# Patient Record
Sex: Female | Born: 1945
Health system: Southern US, Community
[De-identification: ages and names within clinical notes are randomized; demographics above are authoritative.]

---

## 1997-09-04 ENCOUNTER — Other Ambulatory Visit: Admission: RE | Admit: 1997-09-04 | Discharge: 1997-09-04 | Payer: Self-pay | Admitting: Nephrology

## 1997-10-22 ENCOUNTER — Ambulatory Visit (HOSPITAL_COMMUNITY): Admission: RE | Admit: 1997-10-22 | Discharge: 1997-10-22 | Payer: Self-pay | Admitting: Obstetrics

## 1997-10-24 ENCOUNTER — Other Ambulatory Visit: Admission: RE | Admit: 1997-10-24 | Discharge: 1997-10-24 | Payer: Self-pay | Admitting: Obstetrics

## 1998-10-28 ENCOUNTER — Encounter: Payer: Self-pay | Admitting: Obstetrics

## 1998-10-28 ENCOUNTER — Ambulatory Visit (HOSPITAL_COMMUNITY): Admission: RE | Admit: 1998-10-28 | Discharge: 1998-10-28 | Payer: Self-pay | Admitting: Obstetrics

## 1998-11-27 ENCOUNTER — Other Ambulatory Visit: Admission: RE | Admit: 1998-11-27 | Discharge: 1998-11-27 | Payer: Self-pay | Admitting: Obstetrics

## 1999-10-30 ENCOUNTER — Encounter: Payer: Self-pay | Admitting: Obstetrics

## 1999-10-30 ENCOUNTER — Ambulatory Visit (HOSPITAL_COMMUNITY): Admission: RE | Admit: 1999-10-30 | Discharge: 1999-10-30 | Payer: Self-pay

## 1999-12-16 ENCOUNTER — Other Ambulatory Visit: Admission: RE | Admit: 1999-12-16 | Discharge: 1999-12-16 | Payer: Self-pay | Admitting: Obstetrics

## 2000-11-04 ENCOUNTER — Encounter: Payer: Self-pay | Admitting: Obstetrics

## 2000-11-04 ENCOUNTER — Ambulatory Visit (HOSPITAL_COMMUNITY): Admission: RE | Admit: 2000-11-04 | Discharge: 2000-11-04 | Payer: Self-pay | Admitting: Obstetrics

## 2001-12-22 ENCOUNTER — Ambulatory Visit (HOSPITAL_COMMUNITY): Admission: RE | Admit: 2001-12-22 | Discharge: 2001-12-22 | Payer: Self-pay | Admitting: Obstetrics

## 2001-12-22 ENCOUNTER — Encounter: Payer: Self-pay | Admitting: Obstetrics

## 2002-12-25 ENCOUNTER — Ambulatory Visit (HOSPITAL_COMMUNITY): Admission: RE | Admit: 2002-12-25 | Discharge: 2002-12-25 | Payer: Self-pay | Admitting: Obstetrics

## 2002-12-25 ENCOUNTER — Encounter: Payer: Self-pay | Admitting: Obstetrics

## 2003-12-26 ENCOUNTER — Ambulatory Visit (HOSPITAL_COMMUNITY): Admission: RE | Admit: 2003-12-26 | Discharge: 2003-12-26 | Payer: Self-pay | Admitting: Obstetrics

## 2004-05-12 ENCOUNTER — Encounter: Admission: RE | Admit: 2004-05-12 | Discharge: 2004-05-12 | Payer: Self-pay | Admitting: Nephrology

## 2004-12-29 ENCOUNTER — Ambulatory Visit (HOSPITAL_COMMUNITY): Admission: RE | Admit: 2004-12-29 | Discharge: 2004-12-29 | Payer: Self-pay | Admitting: Obstetrics

## 2005-02-23 ENCOUNTER — Inpatient Hospital Stay (HOSPITAL_COMMUNITY): Admission: EM | Admit: 2005-02-23 | Discharge: 2005-02-27 | Payer: Self-pay | Admitting: Emergency Medicine

## 2005-10-14 ENCOUNTER — Ambulatory Visit (HOSPITAL_COMMUNITY): Admission: RE | Admit: 2005-10-14 | Discharge: 2005-10-14 | Payer: Self-pay | Admitting: Obstetrics

## 2006-01-05 ENCOUNTER — Ambulatory Visit (HOSPITAL_COMMUNITY): Admission: RE | Admit: 2006-01-05 | Discharge: 2006-01-05 | Payer: Self-pay | Admitting: Obstetrics

## 2006-09-06 ENCOUNTER — Ambulatory Visit (HOSPITAL_BASED_OUTPATIENT_CLINIC_OR_DEPARTMENT_OTHER): Admission: RE | Admit: 2006-09-06 | Discharge: 2006-09-06 | Payer: Self-pay | Admitting: Podiatry

## 2007-01-09 ENCOUNTER — Ambulatory Visit (HOSPITAL_COMMUNITY): Admission: RE | Admit: 2007-01-09 | Discharge: 2007-01-09 | Payer: Self-pay | Admitting: Obstetrics

## 2008-01-11 ENCOUNTER — Ambulatory Visit (HOSPITAL_COMMUNITY): Admission: RE | Admit: 2008-01-11 | Discharge: 2008-01-11 | Payer: Self-pay | Admitting: Obstetrics

## 2009-01-14 ENCOUNTER — Ambulatory Visit (HOSPITAL_COMMUNITY): Admission: RE | Admit: 2009-01-14 | Discharge: 2009-01-14 | Payer: Self-pay | Admitting: Obstetrics

## 2010-01-20 ENCOUNTER — Ambulatory Visit (HOSPITAL_COMMUNITY): Admission: RE | Admit: 2010-01-20 | Discharge: 2010-01-20 | Payer: Self-pay | Admitting: Obstetrics

## 2010-08-28 NOTE — Op Note (Signed)
Jasmine Stewart, Jasmine Stewart              ACCOUNT NO.:  [id number]   MEDICAL RECORD NO.:  [id number]          PATIENT TYPE:  AMB   LOCATION:  DSC                          FACILITY:  [facility]   PHYSICIAN:  Ezequiel Kayser. Ajlouny, D.P.M.DATE OF BIRTH:  29-Aug-1945   DATE OF PROCEDURE:  09/06/2006  DATE OF DISCHARGE:  09/06/2006                               OPERATIVE REPORT   Dictation Ended At This Point           ______________________________  Ezequiel Kayser. Harriet Pho, D.P.M.     MJA/MEDQ  D:  09/08/2006  T:  09/08/2006  Job:  [id number]

## 2010-08-28 NOTE — H&P (Signed)
NAMEZONIE, CRUTCHER NO.:  [id number]   MEDICAL RECORD NO.:  [id number]          PATIENT TYPE:  INP   LOCATION:  1823                         FACILITY:  [facility]   PHYSICIAN:  Cherylynn Ridges, M.D.    DATE OF BIRTH:  [birth year]   DATE OF ADMISSION:  02/22/2005  DATE OF DISCHARGE:                                HISTORY & PHYSICAL   IDENTIFICATION AND CHIEF COMPLAINT:  The patient is a 66 year old female  with abdominal pain localized to the right lower quadrant who is being  admitted for cecal inflammation, possible typhlitis and/or cecal tumor.   HISTORY OF PRESENT ILLNESS:  The patient was in her usual state of good  health until about noon or shortly after noon on Monday, February 22, 2005.  At that time, she developed some epigastric and upper periumbilical pain,  which is now localized in the right lower quadrant.  It worsened throughout  the day, was associated with nausea but no vomiting, at least not until she  received the oral CT contrast for her CT study.  At that time, she did vomit  the CT contrast, but only after she had gotten the study done.  She has had  no fevers or chills, and currently has an appetite.  She has never had pain  like this before.  A CT scan demonstrates some cecal thickening with  possible tumor and/or cecal inflammation noted, but the appendix could be  seen and appears to be normal.  There is no free air or fluid.  A surgical  consultation was obtained.   PAST MEDICAL HISTORY:  1.  Increased cholesterol.  2.  Hypertension.   PAST SURGICAL HISTORY:  She has only had tonsils and adenoids removed  previously.   OBSTETRICAL HISTORY:  She is gravida 3, para 2-0-1-2, with the death of a  child at 6 months.   Her last bowel movement was yesterday morning.  Her last meal was about 12  o'clock noon on February 22, 2005.   REVIEW OF SYSTEMS:  She has had no constipation or diarrhea.  No fever or  chills.   PHYSICAL EXAMINATION:   VITAL SIGNS:  She is afebrile at 97.6, pulse of 79,  blood pressure 120/54.  HEENT:  She was normocephalic and atraumatic and anicteric.  She has no  scleral icterus.  NECK:  Supple with no palpable masses.  She has no carotid bruits.  LUNGS:  Clear to auscultation, and she has normal symmetrical excursion.  CARDIAC:  Regular rhythm and rate with no murmurs, gallops, rubs, or heaves.  ABDOMEN:  Mildly to moderately distended with active but hypoactive bowel  sounds.  She is tender in the right lower quadrant primarily with some  voluntary guarding in that area.  No severe peritonitis, and she has no  rebound.  She has no Rovsing sign.  RECTAL:  She had no tenderness.  There was a question of a palpable mass at  the tip of the finger on digital examination, but the was guaiac positive.  PELVIC:  Not performed.  NEUROLOGIC:  Cranial nerves II-XII were  grossly intact.   LABORATORY DATA:  Her white count was 18,000, hemoglobin 12.1, hematocrit  36.6, platelets normal.  Her electrolytes were normal with a creatinine of  1.4.  UA is negative.  It appears to be contaminated with epithelial cells  and many bacteria.  On reviewing the CT scan with the radiologist, the  patient has marked cecal thickening and a normal-looking appendage which  courses down posteriorly and inferiorly toward the pelvis.  No other  evidence of inflammation.   IMPRESSION:  1.  Right lower quadrant pain with cecal thickening on CT scan, indicative      of possible typhlitis and/or cecal tumor.  Further examination or      studies may be necessary.  2.  Guaiac positive stools.  The current etiology is unknown; however, on      rectal examination, she does have what appears to be a palpable mass;      however, this could be redundant mucosa or sort of a retroflexed uterus.      Further examination may be necessary.   PLAN:  1.  Admit the patient and observe her.  2.  Start IV antibiotics for this cecitis and/or  typhlitis, and further      evaluation may be necessary.  3.  Notify Dr. Jeri Cos of the patient's admission to the hospital.  4.  Surgery does not appear to be needed at this time; however, it could be      necessary in the future if symptoms should progress.  She will be      started on IV Zosyn 3.375 gm IV piggyback q.6h. and re-examine later on.      She will be admitted to the [facility] surgical service.      Cherylynn Ridges, M.D.  Electronically Signed     JOW/MEDQ  D:  02/23/2005  T:  02/23/2005  Job:  [id number]   cc:   Jarome Matin, M.D.  Fax: [phone]

## 2010-08-28 NOTE — Op Note (Signed)
[name], Jasmine Stewart              ACCOUNT NO.:  [id number]   MEDICAL RECORD NO.:  [id number]          PATIENT TYPE:  AMB   LOCATION:  DSC                          FACILITY:  [facility]   PHYSICIAN:  Ezequiel Kayser. Ajlouny, D.P.M.DATE OF BIRTH:  11/09/1945   DATE OF PROCEDURE:  09/06/2006  DATE OF DISCHARGE:  09/06/2006                               OPERATIVE REPORT   SURGEON:  Ezequiel Kayser. Harriet Pho, DPM.   ASSISTANT:  Myeong Sheard, DPM.   PREOPERATIVE DIAGNOSES:  1. Hallux abducto valgus deformity, left foot.  2. First metatarsus primus varus, left foot.  3. Elongated second metatarsal, left foot.  4. Hammertoe, second and third left toes.   POSTOPERATIVE DIAGNOSES:  1. Hallux abducto valgus deformity, left foot.  2. First metatarsus primus varus, left foot.  3. Elongated second metatarsal, left foot.  4. Hammertoe, second and third left toes.   PROCEDURES:  1. McBride bunionectomy, left foot.  2. First metatarsal first cuneiform fusion of the Lapidus type, with      internal plate fixation and screw fixation, left foot.  3. Second metatarsal shortening with Weil osteotomy, left foot.  4. Hammertoe repair, second and third left toes.   COMPLICATIONS:  None.   ANESTHESIA:  General anesthesia.   HEMOSTASIS:  Pneumatic ankle tourniquet inflated to 250 mmHg.   DESCRIPTION OF PROCEDURES:  The patient was brought to the OR and placed  in a supine position, at which time monitored anesthesia care was  administered.  A local block was performed with a 1-to-1 mixture of 0.5%  Marcaine plain and 1% lidocaine plain.  A well-padded pneumatic ankle  tourniquet was applied to the foot at the medial malleolus.  The patient  was prepped and draped in the usual aseptic manner.  The foot was  exsanguinated with an Esmarch bandage and the previously applied  tourniquet inflated to 250 mmHg.   A dorsolinear incision was made over the first ray, extending proximally  to the tarsometatarsal joint.   The incision was deepened via sharp and  blunt modalities, taking care to clamp and cauterize all bleeding  vessels, and insuring retraction of all neurovascular structures  encountered.  The incision was deepened along the first metatarsal head,  and an inverted-L capsulotomy was performed.  Capsule and periosteum  were reflected from the head of the first metatarsal.  There was very  mild erosion.  And the medial eminence was resected parallel with the  shaft using a sagittal saw, and all rough edges smoothed with the rotary  bur.  Attention was directed to the first interspace, where dissection  was carried deep to the level of the first intermetatarsal ligament,  which was released.  Adequate tendon was freed from the base of the  proximal phalanx, and the fibular sesamoidal ligament was released.  This allowed good medial transposition medially.  Attention was then  directed to the tarsometatarsal joint.  The incision was deepened  through the fascial layers to the dorsal capsule of the tarsometatarsal  joint.  Blunt dissection was used to release the EHL off the TMT and  retract the tendon  laterally.  The location of the first tarsometatarsal  joint was confirmed directly.  We performed a capsulotomy of the  superior aspect of the first TMT to expose the entire joint.  Care was  taken to insure complete exposure of the plantar and lateral aspects of  the joint.  Care was also taken to protect all neurovascular structures  encountered.  The plantar aspect of the first metatarsal was also freed  with a metatarsal elevator.  A bone was used to make these cuts and  remove the cartilage to the level of the subchondral bone at both the  medial cuneiform and base of the first metatarsal.  These bases were  then resected using the curved osteotome to insure the cartilage was  removed and to allow for lateral and plantar transposition of the first  metatarsal.  Reduction of the first  metatarsal and intermetatarsal angle  were found to be excellent.  Once it was determined to use a Darco LPS  plate, which is a locked plate, once the plate was determined with no  stepoff, a smooth 0.045 K wire was used to place the plate on for good  location.  Before this was performed, actually, a smooth 0.045 K wire  was used to fenestrate the joint at the base of the first metatarsal and  the middle cuneiform.  A compression screw wire was then placed, holding  the great toe in dorsiflexion, and the K wire was in a direction  proximal from the metatarsal into the cuneiform under C-arm guidance  into contact with the lateral proximal cortex.  This cannulated  compression screw was placed percutaneously.  This was found to produce  excellent fixation.  Next, plate fixation was performed using the Darco  LPS plate under C-arm.  The locking drill guide was threaded into the  proximal screw hole.  A 2.5-mm drill to drill through the guide was  performed.  There were 4 screws in this plate, which were all drilled,  measured and advanced with the appropriate screw until flush with the  plate.  This placement was all confirmed with the C-arm, and I used the  0 stepoff plate.  The surgical wound was irrigated with copious amounts  of sterile saline and antibiotic solution.  Deep closure was  accomplished with 3-0 Vicryl.  A medial capsulotomy was performed at the  level of the first metatarsal head and reapproximated with 3-0 Vicryl.  Deep closure was accomplished with 4-0 Vicryl, and skin closure was  accomplished with 4-0 Monocryl in a running, subcuticular fashion.  Next, attention was directed to the third toe, where a curvilinear  incision was made over the metatarsophalangeal joint and extending  dorsally and linearly over the third toe.  The incision was deepened via  sharp and blunt modalities.  Care was taken to clamp and cauterize all bleeding vessels and insuring retraction of all  neurovascular structures  encountered.  At the level of the proximal interphalangeal joint, the  extensor tendon was released, and all soft tissue structures removed  from the head of the proximal phalanx.  Once exposed, the head of the  proximal phalanx was resected using the sagittal saw.  This was found  not to reduce the deformity, and attention was directed to the  metatarsophalangeal joint.  The extensor tendon was released at this  level, and the dorsal medial and lateral capsule were closed.  This was  found not to be adequate reduction; and, therefore, McGlamry elevators  were used to release the plantar plate.  The surgical wound was  irrigated with copious amounts of sterile saline and antibiotic  solution.  The digit was stabilized with a 0.045 K wire to the level of  the metatarsal shaft.  The extensor tendon at the level of the proximal  interphalangeal joint was reapproximated utilizing 4-0 Vicryl, and skin  closure was accomplished with 4-0 and 5-0 nylon.  The exact procedure  was performed to the second toe with 1 exception, that the K wire was  only directed to the level of the proximal phalanx.  This incision was  extended proximally over the second metatarsophalangeal joint, and the  incision was deepened via sharp and blunt modalities, taking care to  clamp and cauterize all bleeding vessels, and insuring retraction of all  neurovascular structures encountered.  Deep and superficial fascia were  separated medially and laterally.  Capsulotomy was performed, allowing  for exposure of the second metatarsal head and distal shaft.  A Weil  oblique osteotomy was performed, allowing the head to elevate and  slightly shorten.  The dorsum of the bone was resected using a rongeur.  The osteotomy was fixated with a 2.4-mm Osteomed cannulated screw  system, measuring 14 mm long.  Fixation was confirmed using the C-arm.  Once confirmed, the surgical wound was irrigated with copious  amounts of  sterile saline and antibiotic solution.  Very minimal deep closure was  performed, allowing for rectus position of the second toe, with 3-0  Vicryl.  Subcutaneous closure was accomplished with 4-0 Vicryl, and skin  closure accomplished with 4-0 nylon.  At approximately 1 hour 38  minutes, the tourniquet was deflated, and part of the procedure was  performed wet until 15 minutes had gone by, at which time the tourniquet  was reinflated.  The foot was dressed with tincture, Steri-Strips,  Adaptic, 4 x 4's, Kling and Coban.  The tourniquet was deflated and  vascular status returned to all digits.  Postoperative anesthesia was  also administered around the surgical sites.  While the patient was  still in the OR, a fiberglass posterior splint was applied.  The patient  was sent to the recovery room with vital signs stable and capillary  refill time to the patient's digits instantaneous.  Both written and oral postoperative instructions were given to the patient.  She should  remain nonweightbearing.  No guarantees given.  All questions answered.           ______________________________  Ezequiel Kayser. Harriet Pho, D.P.M.     MJA/MEDQ  D:  09/08/2006  T:  09/08/2006  Job:  [id number]   cc:   Myeong O. Sheard, D.P.M.

## 2010-08-28 NOTE — Discharge Summary (Signed)
NAMEJAUNICE, MIRZA              ACCOUNT NO.:  [id number]   MEDICAL RECORD NO.:  [id number]          PATIENT TYPE:  INP   LOCATION:  5735                         FACILITY:  [facility]   PHYSICIAN:  Maisie Fus A. Cornett, M.D.DATE OF BIRTH:  04/21/1945   DATE OF ADMISSION:  02/22/2005  DATE OF DISCHARGE:  02/27/2005                                 DISCHARGE SUMMARY   ADMISSION DIAGNOSIS:  Right sided diverticulitis/colitis.   POSTOPERATIVE DIAGNOSIS:  Right sided diverticulitis/colitis.   PROCEDURE:  Abdominal CT scan x2.   HISTORY OF PRESENT ILLNESS:  The patient is a 65 year old female admitted on  February 23, 2005 with left lower quadrant pain. CT showed inflammation of  the cecum without appendicitis. She had a leukocytosis and low grade fever.  She is admitted for intravenous antibiotics and followup.   HOSPITAL COURSE:  She was followed placed on antibiotics daily for right  sided diverticulitis/colitis. Repeat CT scan on February 26, 2005 showed  less inflammation but the concern was raised for a possible malignancy.  There was some stranding around this area but no obvious major abscess that  we could see. Her white count normalized. Her fever curve went away and she  clinically improved. At this point in time, she was tolerating a regular  diet, was passing gas, having bowel function and was without complaint and  feeling much better.   DISPOSITION:  She was discharged home at that point in time on February 27, 2005 in satisfactory condition. I explained to her that this could  potentially represent a malignancy. Without a colonoscopy, this is almost  impossible to tell from CT scanning alone. As her other CT scans I felt  these were improving and with her improving white count and improving fever  curve, she was discharged home on antibiotics for the next 7 to 10 days.   FOLLOW UP:  She will followup with me in 2-3 weeks. I will see her back in  my clinic and arrange for her  to get a colonoscopy at a later time.   DISCHARGE MEDICATIONS:  1.  Augmentin 875 mg b.i.d. for 10 days.  2.  Flagyl 500 mg p.o. t.i.d. for 10 days.  3.  Colace stool softener.  She will resume her preoperative medications as before.   ACTIVITY:  She wanted to go back to work. I told her to wait for about a  week, to make sure she is feeling well enough to return to work.      Thomas A. Cornett, M.D.  Electronically Signed     TAC/MEDQ  D:  02/27/2005  T:  02/28/2005  Job:  [id number]   cc:   [facility]

## 2010-12-28 ENCOUNTER — Other Ambulatory Visit (HOSPITAL_COMMUNITY): Payer: Self-pay | Admitting: Obstetrics

## 2010-12-28 DIAGNOSIS — Z1231 Encounter for screening mammogram for malignant neoplasm of breast: Secondary | ICD-10-CM

## 2011-01-26 ENCOUNTER — Ambulatory Visit (HOSPITAL_COMMUNITY)
Admission: RE | Admit: 2011-01-26 | Discharge: 2011-01-26 | Disposition: A | Discharge status: Home / Self Care | Payer: BC Managed Care – PPO | Source: Ambulatory Visit | Attending: Obstetrics | Admitting: Obstetrics

## 2011-01-26 DIAGNOSIS — Z1231 Encounter for screening mammogram for malignant neoplasm of breast: Secondary | ICD-10-CM | POA: Insufficient documentation

## 2012-01-17 ENCOUNTER — Other Ambulatory Visit (HOSPITAL_COMMUNITY): Payer: Self-pay | Admitting: Family Medicine

## 2012-01-17 DIAGNOSIS — Z1231 Encounter for screening mammogram for malignant neoplasm of breast: Secondary | ICD-10-CM

## 2012-02-02 ENCOUNTER — Ambulatory Visit (HOSPITAL_COMMUNITY)
Admission: RE | Admit: 2012-02-02 | Discharge: 2012-02-02 | Disposition: A | Discharge status: Home / Self Care | Payer: Medicare HMO | Source: Ambulatory Visit | Attending: Family Medicine | Admitting: Family Medicine

## 2012-02-02 DIAGNOSIS — Z1231 Encounter for screening mammogram for malignant neoplasm of breast: Secondary | ICD-10-CM | POA: Insufficient documentation

## 2013-01-02 ENCOUNTER — Other Ambulatory Visit (HOSPITAL_COMMUNITY): Payer: Self-pay | Admitting: Family Medicine

## 2013-01-02 DIAGNOSIS — Z1231 Encounter for screening mammogram for malignant neoplasm of breast: Secondary | ICD-10-CM

## 2013-02-02 ENCOUNTER — Ambulatory Visit (HOSPITAL_COMMUNITY)
Admission: RE | Admit: 2013-02-02 | Discharge: 2013-02-02 | Disposition: A | Discharge status: Home / Self Care | Payer: Medicare HMO | Source: Ambulatory Visit | Attending: Family Medicine | Admitting: Family Medicine

## 2013-02-02 DIAGNOSIS — Z1231 Encounter for screening mammogram for malignant neoplasm of breast: Secondary | ICD-10-CM

## 2013-03-26 ENCOUNTER — Other Ambulatory Visit: Payer: Self-pay | Admitting: Family Medicine

## 2013-03-26 ENCOUNTER — Ambulatory Visit
Admission: RE | Admit: 2013-03-26 | Discharge: 2013-03-26 | Disposition: A | Discharge status: Home / Self Care | Payer: Medicare HMO | Source: Ambulatory Visit | Attending: Family Medicine | Admitting: Family Medicine

## 2013-03-26 DIAGNOSIS — M1250 Traumatic arthropathy, unspecified site: Secondary | ICD-10-CM

## 2014-01-02 ENCOUNTER — Other Ambulatory Visit (HOSPITAL_COMMUNITY): Payer: Self-pay | Admitting: Family Medicine

## 2014-01-02 DIAGNOSIS — Z1231 Encounter for screening mammogram for malignant neoplasm of breast: Secondary | ICD-10-CM

## 2014-02-04 ENCOUNTER — Ambulatory Visit (HOSPITAL_COMMUNITY)
Admission: RE | Admit: 2014-02-04 | Discharge: 2014-02-04 | Disposition: A | Discharge status: Home / Self Care | Payer: Medicare HMO | Source: Ambulatory Visit | Attending: Family Medicine | Admitting: Family Medicine

## 2014-02-04 DIAGNOSIS — Z1231 Encounter for screening mammogram for malignant neoplasm of breast: Secondary | ICD-10-CM | POA: Insufficient documentation

## 2014-05-11 DIAGNOSIS — E669 Obesity, unspecified: Secondary | ICD-10-CM | POA: Diagnosis not present

## 2014-05-11 DIAGNOSIS — E039 Hypothyroidism, unspecified: Secondary | ICD-10-CM | POA: Diagnosis not present

## 2014-05-11 DIAGNOSIS — I10 Essential (primary) hypertension: Secondary | ICD-10-CM | POA: Diagnosis not present

## 2014-05-11 DIAGNOSIS — E0800 Diabetes mellitus due to underlying condition with hyperosmolarity without nonketotic hyperglycemic-hyperosmolar coma (NKHHC): Secondary | ICD-10-CM | POA: Diagnosis not present

## 2014-06-26 DIAGNOSIS — H4011X1 Primary open-angle glaucoma, mild stage: Secondary | ICD-10-CM | POA: Diagnosis not present

## 2014-08-09 DIAGNOSIS — K219 Gastro-esophageal reflux disease without esophagitis: Secondary | ICD-10-CM | POA: Diagnosis not present

## 2014-08-09 DIAGNOSIS — E039 Hypothyroidism, unspecified: Secondary | ICD-10-CM | POA: Diagnosis not present

## 2014-08-09 DIAGNOSIS — I10 Essential (primary) hypertension: Secondary | ICD-10-CM | POA: Diagnosis not present

## 2014-08-09 DIAGNOSIS — J399 Disease of upper respiratory tract, unspecified: Secondary | ICD-10-CM | POA: Diagnosis not present

## 2014-11-05 DIAGNOSIS — E669 Obesity, unspecified: Secondary | ICD-10-CM | POA: Diagnosis not present

## 2014-11-05 DIAGNOSIS — I10 Essential (primary) hypertension: Secondary | ICD-10-CM | POA: Diagnosis not present

## 2014-11-05 DIAGNOSIS — E0800 Diabetes mellitus due to underlying condition with hyperosmolarity without nonketotic hyperglycemic-hyperosmolar coma (NKHHC): Secondary | ICD-10-CM | POA: Diagnosis not present

## 2014-11-05 DIAGNOSIS — E039 Hypothyroidism, unspecified: Secondary | ICD-10-CM | POA: Diagnosis not present

## 2014-11-22 DIAGNOSIS — H4011X1 Primary open-angle glaucoma, mild stage: Secondary | ICD-10-CM | POA: Diagnosis not present

## 2015-01-09 ENCOUNTER — Other Ambulatory Visit: Payer: Self-pay

## 2015-01-09 DIAGNOSIS — Z1231 Encounter for screening mammogram for malignant neoplasm of breast: Secondary | ICD-10-CM

## 2015-01-31 DIAGNOSIS — E0800 Diabetes mellitus due to underlying condition with hyperosmolarity without nonketotic hyperglycemic-hyperosmolar coma (NKHHC): Secondary | ICD-10-CM | POA: Diagnosis not present

## 2015-01-31 DIAGNOSIS — I10 Essential (primary) hypertension: Secondary | ICD-10-CM | POA: Diagnosis not present

## 2015-01-31 DIAGNOSIS — E669 Obesity, unspecified: Secondary | ICD-10-CM | POA: Diagnosis not present

## 2015-01-31 DIAGNOSIS — E039 Hypothyroidism, unspecified: Secondary | ICD-10-CM | POA: Diagnosis not present

## 2015-02-04 DIAGNOSIS — E089 Diabetes mellitus due to underlying condition without complications: Secondary | ICD-10-CM | POA: Diagnosis not present

## 2015-02-04 DIAGNOSIS — E669 Obesity, unspecified: Secondary | ICD-10-CM | POA: Diagnosis not present

## 2015-02-04 DIAGNOSIS — I10 Essential (primary) hypertension: Secondary | ICD-10-CM | POA: Diagnosis not present

## 2015-02-04 DIAGNOSIS — E039 Hypothyroidism, unspecified: Secondary | ICD-10-CM | POA: Diagnosis not present

## 2015-02-07 ENCOUNTER — Ambulatory Visit
Admission: RE | Admit: 2015-02-07 | Discharge: 2015-02-07 | Disposition: A | Discharge status: Home / Self Care | Payer: Commercial Managed Care - HMO | Source: Ambulatory Visit

## 2015-02-07 DIAGNOSIS — Z1231 Encounter for screening mammogram for malignant neoplasm of breast: Secondary | ICD-10-CM | POA: Diagnosis not present

## 2015-04-01 DIAGNOSIS — H401131 Primary open-angle glaucoma, bilateral, mild stage: Secondary | ICD-10-CM | POA: Diagnosis not present

## 2015-05-26 DIAGNOSIS — K5730 Diverticulosis of large intestine without perforation or abscess without bleeding: Secondary | ICD-10-CM | POA: Diagnosis not present

## 2015-05-26 DIAGNOSIS — Z1211 Encounter for screening for malignant neoplasm of colon: Secondary | ICD-10-CM | POA: Diagnosis not present

## 2015-05-26 DIAGNOSIS — K640 First degree hemorrhoids: Secondary | ICD-10-CM | POA: Diagnosis not present

## 2015-06-06 DIAGNOSIS — E089 Diabetes mellitus due to underlying condition without complications: Secondary | ICD-10-CM | POA: Diagnosis not present

## 2015-06-06 DIAGNOSIS — E039 Hypothyroidism, unspecified: Secondary | ICD-10-CM | POA: Diagnosis not present

## 2015-06-06 DIAGNOSIS — E669 Obesity, unspecified: Secondary | ICD-10-CM | POA: Diagnosis not present

## 2015-06-06 DIAGNOSIS — Z6841 Body Mass Index (BMI) 40.0 and over, adult: Secondary | ICD-10-CM | POA: Diagnosis not present

## 2015-06-06 DIAGNOSIS — I10 Essential (primary) hypertension: Secondary | ICD-10-CM | POA: Diagnosis not present

## 2015-06-06 DIAGNOSIS — Z23 Encounter for immunization: Secondary | ICD-10-CM | POA: Diagnosis not present

## 2015-09-01 DIAGNOSIS — J0100 Acute maxillary sinusitis, unspecified: Secondary | ICD-10-CM | POA: Diagnosis not present

## 2015-09-01 DIAGNOSIS — H68013 Acute Eustachian salpingitis, bilateral: Secondary | ICD-10-CM | POA: Diagnosis not present

## 2015-09-15 DIAGNOSIS — J0100 Acute maxillary sinusitis, unspecified: Secondary | ICD-10-CM | POA: Diagnosis not present

## 2015-09-15 DIAGNOSIS — H68013 Acute Eustachian salpingitis, bilateral: Secondary | ICD-10-CM | POA: Diagnosis not present

## 2015-09-23 DIAGNOSIS — J343 Hypertrophy of nasal turbinates: Secondary | ICD-10-CM | POA: Diagnosis not present

## 2015-09-23 DIAGNOSIS — J302 Other seasonal allergic rhinitis: Secondary | ICD-10-CM | POA: Diagnosis not present

## 2015-09-23 DIAGNOSIS — H6502 Acute serous otitis media, left ear: Secondary | ICD-10-CM | POA: Diagnosis not present

## 2015-09-23 DIAGNOSIS — H6121 Impacted cerumen, right ear: Secondary | ICD-10-CM | POA: Diagnosis not present

## 2015-10-08 DIAGNOSIS — R7309 Other abnormal glucose: Secondary | ICD-10-CM | POA: Diagnosis not present

## 2015-10-08 DIAGNOSIS — E785 Hyperlipidemia, unspecified: Secondary | ICD-10-CM | POA: Diagnosis not present

## 2015-10-08 DIAGNOSIS — I10 Essential (primary) hypertension: Secondary | ICD-10-CM | POA: Diagnosis not present

## 2015-10-08 DIAGNOSIS — E089 Diabetes mellitus due to underlying condition without complications: Secondary | ICD-10-CM | POA: Diagnosis not present

## 2015-10-10 DIAGNOSIS — E089 Diabetes mellitus due to underlying condition without complications: Secondary | ICD-10-CM | POA: Diagnosis not present

## 2015-10-10 DIAGNOSIS — E039 Hypothyroidism, unspecified: Secondary | ICD-10-CM | POA: Diagnosis not present

## 2015-10-10 DIAGNOSIS — E669 Obesity, unspecified: Secondary | ICD-10-CM | POA: Diagnosis not present

## 2015-10-10 DIAGNOSIS — I10 Essential (primary) hypertension: Secondary | ICD-10-CM | POA: Diagnosis not present

## 2015-12-05 DIAGNOSIS — H401131 Primary open-angle glaucoma, bilateral, mild stage: Secondary | ICD-10-CM | POA: Diagnosis not present

## 2016-01-26 ENCOUNTER — Other Ambulatory Visit: Payer: Self-pay | Admitting: Family Medicine

## 2016-01-26 DIAGNOSIS — Z1231 Encounter for screening mammogram for malignant neoplasm of breast: Secondary | ICD-10-CM

## 2016-02-09 ENCOUNTER — Ambulatory Visit
Admission: RE | Admit: 2016-02-09 | Discharge: 2016-02-09 | Disposition: A | Discharge status: Home / Self Care | Payer: Commercial Managed Care - HMO | Source: Ambulatory Visit | Attending: Family Medicine | Admitting: Family Medicine

## 2016-02-09 DIAGNOSIS — E669 Obesity, unspecified: Secondary | ICD-10-CM | POA: Diagnosis not present

## 2016-02-09 DIAGNOSIS — I10 Essential (primary) hypertension: Secondary | ICD-10-CM | POA: Diagnosis not present

## 2016-02-09 DIAGNOSIS — Z1231 Encounter for screening mammogram for malignant neoplasm of breast: Secondary | ICD-10-CM | POA: Diagnosis not present

## 2016-02-09 DIAGNOSIS — E039 Hypothyroidism, unspecified: Secondary | ICD-10-CM | POA: Diagnosis not present

## 2016-02-09 DIAGNOSIS — E089 Diabetes mellitus due to underlying condition without complications: Secondary | ICD-10-CM | POA: Diagnosis not present

## 2016-02-09 DIAGNOSIS — E119 Type 2 diabetes mellitus without complications: Secondary | ICD-10-CM | POA: Diagnosis not present

## 2016-02-13 DIAGNOSIS — Z6841 Body Mass Index (BMI) 40.0 and over, adult: Secondary | ICD-10-CM | POA: Diagnosis not present

## 2016-02-13 DIAGNOSIS — E089 Diabetes mellitus due to underlying condition without complications: Secondary | ICD-10-CM | POA: Diagnosis not present

## 2016-02-13 DIAGNOSIS — I10 Essential (primary) hypertension: Secondary | ICD-10-CM | POA: Diagnosis not present

## 2016-02-13 DIAGNOSIS — K219 Gastro-esophageal reflux disease without esophagitis: Secondary | ICD-10-CM | POA: Diagnosis not present

## 2016-06-10 DIAGNOSIS — H401131 Primary open-angle glaucoma, bilateral, mild stage: Secondary | ICD-10-CM | POA: Diagnosis not present

## 2016-06-16 DIAGNOSIS — E039 Hypothyroidism, unspecified: Secondary | ICD-10-CM | POA: Diagnosis not present

## 2016-06-16 DIAGNOSIS — E089 Diabetes mellitus due to underlying condition without complications: Secondary | ICD-10-CM | POA: Diagnosis not present

## 2016-06-16 DIAGNOSIS — I10 Essential (primary) hypertension: Secondary | ICD-10-CM | POA: Diagnosis not present

## 2016-06-16 DIAGNOSIS — E669 Obesity, unspecified: Secondary | ICD-10-CM | POA: Diagnosis not present

## 2016-06-18 ENCOUNTER — Ambulatory Visit
Admission: RE | Admit: 2016-06-18 | Discharge: 2016-06-18 | Disposition: A | Discharge status: Home / Self Care | Payer: Commercial Managed Care - HMO | Source: Ambulatory Visit | Attending: Family Medicine | Admitting: Family Medicine

## 2016-06-18 ENCOUNTER — Other Ambulatory Visit: Payer: Self-pay | Admitting: Family Medicine

## 2016-06-18 DIAGNOSIS — E669 Obesity, unspecified: Secondary | ICD-10-CM | POA: Diagnosis not present

## 2016-06-18 DIAGNOSIS — M19041 Primary osteoarthritis, right hand: Secondary | ICD-10-CM | POA: Diagnosis not present

## 2016-06-18 DIAGNOSIS — M150 Primary generalized (osteo)arthritis: Secondary | ICD-10-CM

## 2016-06-18 DIAGNOSIS — I10 Essential (primary) hypertension: Secondary | ICD-10-CM | POA: Diagnosis not present

## 2016-06-18 DIAGNOSIS — M19042 Primary osteoarthritis, left hand: Secondary | ICD-10-CM | POA: Diagnosis not present

## 2016-06-18 DIAGNOSIS — M19031 Primary osteoarthritis, right wrist: Secondary | ICD-10-CM | POA: Diagnosis not present

## 2016-06-18 DIAGNOSIS — E039 Hypothyroidism, unspecified: Secondary | ICD-10-CM | POA: Diagnosis not present

## 2016-06-18 DIAGNOSIS — M130 Polyarthritis, unspecified: Secondary | ICD-10-CM | POA: Diagnosis not present

## 2016-06-18 DIAGNOSIS — M19032 Primary osteoarthritis, left wrist: Secondary | ICD-10-CM | POA: Diagnosis not present

## 2016-07-02 DIAGNOSIS — E669 Obesity, unspecified: Secondary | ICD-10-CM | POA: Diagnosis not present

## 2016-07-02 DIAGNOSIS — I10 Essential (primary) hypertension: Secondary | ICD-10-CM | POA: Diagnosis not present

## 2016-07-02 DIAGNOSIS — E039 Hypothyroidism, unspecified: Secondary | ICD-10-CM | POA: Diagnosis not present

## 2016-07-02 DIAGNOSIS — M130 Polyarthritis, unspecified: Secondary | ICD-10-CM | POA: Diagnosis not present

## 2016-10-19 DIAGNOSIS — H401131 Primary open-angle glaucoma, bilateral, mild stage: Secondary | ICD-10-CM | POA: Diagnosis not present

## 2016-10-19 DIAGNOSIS — H5712 Ocular pain, left eye: Secondary | ICD-10-CM | POA: Diagnosis not present

## 2016-10-28 DIAGNOSIS — M130 Polyarthritis, unspecified: Secondary | ICD-10-CM | POA: Diagnosis not present

## 2016-10-28 DIAGNOSIS — E669 Obesity, unspecified: Secondary | ICD-10-CM | POA: Diagnosis not present

## 2016-10-28 DIAGNOSIS — E039 Hypothyroidism, unspecified: Secondary | ICD-10-CM | POA: Diagnosis not present

## 2016-10-28 DIAGNOSIS — I10 Essential (primary) hypertension: Secondary | ICD-10-CM | POA: Diagnosis not present

## 2016-10-28 DIAGNOSIS — E119 Type 2 diabetes mellitus without complications: Secondary | ICD-10-CM | POA: Diagnosis not present

## 2016-11-03 DIAGNOSIS — E089 Diabetes mellitus due to underlying condition without complications: Secondary | ICD-10-CM | POA: Diagnosis not present

## 2016-11-03 DIAGNOSIS — E039 Hypothyroidism, unspecified: Secondary | ICD-10-CM | POA: Diagnosis not present

## 2016-11-03 DIAGNOSIS — M130 Polyarthritis, unspecified: Secondary | ICD-10-CM | POA: Diagnosis not present

## 2016-11-03 DIAGNOSIS — I10 Essential (primary) hypertension: Secondary | ICD-10-CM | POA: Diagnosis not present

## 2016-12-03 DIAGNOSIS — M19071 Primary osteoarthritis, right ankle and foot: Secondary | ICD-10-CM | POA: Diagnosis not present

## 2016-12-03 DIAGNOSIS — B351 Tinea unguium: Secondary | ICD-10-CM | POA: Diagnosis not present

## 2016-12-03 DIAGNOSIS — S82831A Other fracture of upper and lower end of right fibula, initial encounter for closed fracture: Secondary | ICD-10-CM | POA: Diagnosis not present

## 2016-12-10 DIAGNOSIS — S82831D Other fracture of upper and lower end of right fibula, subsequent encounter for closed fracture with routine healing: Secondary | ICD-10-CM | POA: Diagnosis not present

## 2016-12-10 DIAGNOSIS — M19071 Primary osteoarthritis, right ankle and foot: Secondary | ICD-10-CM | POA: Diagnosis not present

## 2016-12-23 DIAGNOSIS — M7751 Other enthesopathy of right foot: Secondary | ICD-10-CM | POA: Diagnosis not present

## 2016-12-23 DIAGNOSIS — M19071 Primary osteoarthritis, right ankle and foot: Secondary | ICD-10-CM | POA: Diagnosis not present

## 2016-12-23 DIAGNOSIS — S82831D Other fracture of upper and lower end of right fibula, subsequent encounter for closed fracture with routine healing: Secondary | ICD-10-CM | POA: Diagnosis not present

## 2016-12-30 ENCOUNTER — Other Ambulatory Visit: Payer: Self-pay | Admitting: Family Medicine

## 2016-12-30 DIAGNOSIS — Z1231 Encounter for screening mammogram for malignant neoplasm of breast: Secondary | ICD-10-CM

## 2017-01-06 DIAGNOSIS — L600 Ingrowing nail: Secondary | ICD-10-CM | POA: Diagnosis not present

## 2017-01-06 DIAGNOSIS — S82831D Other fracture of upper and lower end of right fibula, subsequent encounter for closed fracture with routine healing: Secondary | ICD-10-CM | POA: Diagnosis not present

## 2017-01-17 DIAGNOSIS — Z0000 Encounter for general adult medical examination without abnormal findings: Secondary | ICD-10-CM | POA: Diagnosis not present

## 2017-01-17 DIAGNOSIS — R109 Unspecified abdominal pain: Secondary | ICD-10-CM | POA: Diagnosis not present

## 2017-02-18 ENCOUNTER — Ambulatory Visit
Admission: RE | Admit: 2017-02-18 | Discharge: 2017-02-18 | Disposition: A | Discharge status: Home / Self Care | Payer: Medicare HMO | Source: Ambulatory Visit | Attending: Family Medicine | Admitting: Family Medicine

## 2017-02-18 DIAGNOSIS — Z1231 Encounter for screening mammogram for malignant neoplasm of breast: Secondary | ICD-10-CM

## 2017-03-09 DIAGNOSIS — M130 Polyarthritis, unspecified: Secondary | ICD-10-CM | POA: Diagnosis not present

## 2017-03-09 DIAGNOSIS — E785 Hyperlipidemia, unspecified: Secondary | ICD-10-CM | POA: Diagnosis not present

## 2017-03-09 DIAGNOSIS — E669 Obesity, unspecified: Secondary | ICD-10-CM | POA: Diagnosis not present

## 2017-03-09 DIAGNOSIS — E039 Hypothyroidism, unspecified: Secondary | ICD-10-CM | POA: Diagnosis not present

## 2017-03-09 DIAGNOSIS — I10 Essential (primary) hypertension: Secondary | ICD-10-CM | POA: Diagnosis not present

## 2017-03-09 DIAGNOSIS — E119 Type 2 diabetes mellitus without complications: Secondary | ICD-10-CM | POA: Diagnosis not present

## 2017-03-15 DIAGNOSIS — E089 Diabetes mellitus due to underlying condition without complications: Secondary | ICD-10-CM | POA: Diagnosis not present

## 2017-03-15 DIAGNOSIS — E039 Hypothyroidism, unspecified: Secondary | ICD-10-CM | POA: Diagnosis not present

## 2017-03-15 DIAGNOSIS — I10 Essential (primary) hypertension: Secondary | ICD-10-CM | POA: Diagnosis not present

## 2017-03-28 DIAGNOSIS — Z780 Asymptomatic menopausal state: Secondary | ICD-10-CM | POA: Diagnosis not present

## 2017-05-28 DIAGNOSIS — R05 Cough: Secondary | ICD-10-CM | POA: Diagnosis not present

## 2017-05-28 DIAGNOSIS — J019 Acute sinusitis, unspecified: Secondary | ICD-10-CM | POA: Diagnosis not present

## 2017-06-01 DIAGNOSIS — J069 Acute upper respiratory infection, unspecified: Secondary | ICD-10-CM | POA: Diagnosis not present

## 2017-06-07 DIAGNOSIS — E039 Hypothyroidism, unspecified: Secondary | ICD-10-CM | POA: Diagnosis not present

## 2017-06-07 DIAGNOSIS — E6609 Other obesity due to excess calories: Secondary | ICD-10-CM | POA: Diagnosis not present

## 2017-06-07 DIAGNOSIS — E119 Type 2 diabetes mellitus without complications: Secondary | ICD-10-CM | POA: Diagnosis not present

## 2017-06-07 DIAGNOSIS — I10 Essential (primary) hypertension: Secondary | ICD-10-CM | POA: Diagnosis not present

## 2017-06-07 DIAGNOSIS — M130 Polyarthritis, unspecified: Secondary | ICD-10-CM | POA: Diagnosis not present

## 2017-06-13 DIAGNOSIS — E119 Type 2 diabetes mellitus without complications: Secondary | ICD-10-CM | POA: Diagnosis not present

## 2017-06-13 DIAGNOSIS — I10 Essential (primary) hypertension: Secondary | ICD-10-CM | POA: Diagnosis not present

## 2017-06-13 DIAGNOSIS — E6609 Other obesity due to excess calories: Secondary | ICD-10-CM | POA: Diagnosis not present

## 2017-07-27 DIAGNOSIS — E119 Type 2 diabetes mellitus without complications: Secondary | ICD-10-CM | POA: Diagnosis not present

## 2017-07-27 DIAGNOSIS — H401131 Primary open-angle glaucoma, bilateral, mild stage: Secondary | ICD-10-CM | POA: Diagnosis not present

## 2017-07-27 DIAGNOSIS — H2513 Age-related nuclear cataract, bilateral: Secondary | ICD-10-CM | POA: Diagnosis not present

## 2017-09-07 DIAGNOSIS — M130 Polyarthritis, unspecified: Secondary | ICD-10-CM | POA: Diagnosis not present

## 2017-09-07 DIAGNOSIS — E6609 Other obesity due to excess calories: Secondary | ICD-10-CM | POA: Diagnosis not present

## 2017-09-07 DIAGNOSIS — I10 Essential (primary) hypertension: Secondary | ICD-10-CM | POA: Diagnosis not present

## 2017-09-07 DIAGNOSIS — E119 Type 2 diabetes mellitus without complications: Secondary | ICD-10-CM | POA: Diagnosis not present

## 2017-09-12 DIAGNOSIS — I10 Essential (primary) hypertension: Secondary | ICD-10-CM | POA: Diagnosis not present

## 2017-09-12 DIAGNOSIS — M130 Polyarthritis, unspecified: Secondary | ICD-10-CM | POA: Diagnosis not present

## 2017-09-12 DIAGNOSIS — E089 Diabetes mellitus due to underlying condition without complications: Secondary | ICD-10-CM | POA: Diagnosis not present

## 2017-09-12 DIAGNOSIS — E669 Obesity, unspecified: Secondary | ICD-10-CM | POA: Diagnosis not present

## 2017-11-01 DIAGNOSIS — H401131 Primary open-angle glaucoma, bilateral, mild stage: Secondary | ICD-10-CM | POA: Diagnosis not present

## 2017-11-01 DIAGNOSIS — H2513 Age-related nuclear cataract, bilateral: Secondary | ICD-10-CM | POA: Diagnosis not present

## 2017-11-01 DIAGNOSIS — E119 Type 2 diabetes mellitus without complications: Secondary | ICD-10-CM | POA: Diagnosis not present

## 2017-11-01 DIAGNOSIS — H31002 Unspecified chorioretinal scars, left eye: Secondary | ICD-10-CM | POA: Diagnosis not present

## 2017-12-19 DIAGNOSIS — J069 Acute upper respiratory infection, unspecified: Secondary | ICD-10-CM | POA: Diagnosis not present

## 2017-12-19 DIAGNOSIS — Z6841 Body Mass Index (BMI) 40.0 and over, adult: Secondary | ICD-10-CM | POA: Diagnosis not present

## 2018-01-09 DIAGNOSIS — I10 Essential (primary) hypertension: Secondary | ICD-10-CM | POA: Diagnosis not present

## 2018-01-09 DIAGNOSIS — E0800 Diabetes mellitus due to underlying condition with hyperosmolarity without nonketotic hyperglycemic-hyperosmolar coma (NKHHC): Secondary | ICD-10-CM | POA: Diagnosis not present

## 2018-01-09 DIAGNOSIS — E039 Hypothyroidism, unspecified: Secondary | ICD-10-CM | POA: Diagnosis not present

## 2018-01-09 DIAGNOSIS — J399 Disease of upper respiratory tract, unspecified: Secondary | ICD-10-CM | POA: Diagnosis not present

## 2018-01-09 DIAGNOSIS — E669 Obesity, unspecified: Secondary | ICD-10-CM | POA: Diagnosis not present

## 2018-01-13 DIAGNOSIS — Z6839 Body mass index (BMI) 39.0-39.9, adult: Secondary | ICD-10-CM | POA: Diagnosis not present

## 2018-01-13 DIAGNOSIS — E6609 Other obesity due to excess calories: Secondary | ICD-10-CM | POA: Diagnosis not present

## 2018-01-13 DIAGNOSIS — M130 Polyarthritis, unspecified: Secondary | ICD-10-CM | POA: Diagnosis not present

## 2018-01-13 DIAGNOSIS — I10 Essential (primary) hypertension: Secondary | ICD-10-CM | POA: Diagnosis not present

## 2018-01-13 DIAGNOSIS — K219 Gastro-esophageal reflux disease without esophagitis: Secondary | ICD-10-CM | POA: Diagnosis not present

## 2018-01-13 DIAGNOSIS — E039 Hypothyroidism, unspecified: Secondary | ICD-10-CM | POA: Diagnosis not present

## 2018-01-16 ENCOUNTER — Other Ambulatory Visit: Payer: Self-pay | Admitting: Family Medicine

## 2018-01-16 DIAGNOSIS — Z1231 Encounter for screening mammogram for malignant neoplasm of breast: Secondary | ICD-10-CM

## 2018-02-10 DIAGNOSIS — M130 Polyarthritis, unspecified: Secondary | ICD-10-CM | POA: Diagnosis not present

## 2018-02-10 DIAGNOSIS — I10 Essential (primary) hypertension: Secondary | ICD-10-CM | POA: Diagnosis not present

## 2018-02-10 DIAGNOSIS — Z6839 Body mass index (BMI) 39.0-39.9, adult: Secondary | ICD-10-CM | POA: Diagnosis not present

## 2018-02-10 DIAGNOSIS — Z0000 Encounter for general adult medical examination without abnormal findings: Secondary | ICD-10-CM | POA: Diagnosis not present

## 2018-02-20 ENCOUNTER — Ambulatory Visit
Admission: RE | Admit: 2018-02-20 | Discharge: 2018-02-20 | Disposition: A | Discharge status: Home / Self Care | Payer: Medicare HMO | Source: Ambulatory Visit | Attending: Family Medicine | Admitting: Family Medicine

## 2018-02-20 DIAGNOSIS — Z1231 Encounter for screening mammogram for malignant neoplasm of breast: Secondary | ICD-10-CM | POA: Diagnosis not present

## 2018-03-01 DIAGNOSIS — H401131 Primary open-angle glaucoma, bilateral, mild stage: Secondary | ICD-10-CM | POA: Diagnosis not present

## 2018-03-01 DIAGNOSIS — H2513 Age-related nuclear cataract, bilateral: Secondary | ICD-10-CM | POA: Diagnosis not present

## 2018-03-01 DIAGNOSIS — H31002 Unspecified chorioretinal scars, left eye: Secondary | ICD-10-CM | POA: Diagnosis not present

## 2018-05-16 DIAGNOSIS — R7309 Other abnormal glucose: Secondary | ICD-10-CM | POA: Diagnosis not present

## 2018-05-16 DIAGNOSIS — E119 Type 2 diabetes mellitus without complications: Secondary | ICD-10-CM | POA: Diagnosis not present

## 2018-05-16 DIAGNOSIS — R799 Abnormal finding of blood chemistry, unspecified: Secondary | ICD-10-CM | POA: Diagnosis not present

## 2018-05-16 DIAGNOSIS — I10 Essential (primary) hypertension: Secondary | ICD-10-CM | POA: Diagnosis not present

## 2018-05-19 DIAGNOSIS — K219 Gastro-esophageal reflux disease without esophagitis: Secondary | ICD-10-CM | POA: Diagnosis not present

## 2018-05-19 DIAGNOSIS — E668 Other obesity: Secondary | ICD-10-CM | POA: Diagnosis not present

## 2018-05-19 DIAGNOSIS — M130 Polyarthritis, unspecified: Secondary | ICD-10-CM | POA: Diagnosis not present

## 2018-05-19 DIAGNOSIS — I10 Essential (primary) hypertension: Secondary | ICD-10-CM | POA: Diagnosis not present

## 2018-09-19 DIAGNOSIS — R7989 Other specified abnormal findings of blood chemistry: Secondary | ICD-10-CM | POA: Diagnosis not present

## 2018-09-19 DIAGNOSIS — E668 Other obesity: Secondary | ICD-10-CM | POA: Diagnosis not present

## 2018-09-19 DIAGNOSIS — I10 Essential (primary) hypertension: Secondary | ICD-10-CM | POA: Diagnosis not present

## 2018-09-19 DIAGNOSIS — D510 Vitamin B12 deficiency anemia due to intrinsic factor deficiency: Secondary | ICD-10-CM | POA: Diagnosis not present

## 2018-09-19 DIAGNOSIS — M130 Polyarthritis, unspecified: Secondary | ICD-10-CM | POA: Diagnosis not present

## 2018-09-19 DIAGNOSIS — E1169 Type 2 diabetes mellitus with other specified complication: Secondary | ICD-10-CM | POA: Diagnosis not present

## 2018-09-19 DIAGNOSIS — E039 Hypothyroidism, unspecified: Secondary | ICD-10-CM | POA: Diagnosis not present

## 2018-09-19 DIAGNOSIS — R799 Abnormal finding of blood chemistry, unspecified: Secondary | ICD-10-CM | POA: Diagnosis not present

## 2018-09-19 DIAGNOSIS — L638 Other alopecia areata: Secondary | ICD-10-CM | POA: Diagnosis not present

## 2018-09-19 DIAGNOSIS — K219 Gastro-esophageal reflux disease without esophagitis: Secondary | ICD-10-CM | POA: Diagnosis not present

## 2018-09-22 DIAGNOSIS — E1169 Type 2 diabetes mellitus with other specified complication: Secondary | ICD-10-CM | POA: Diagnosis not present

## 2018-09-22 DIAGNOSIS — E039 Hypothyroidism, unspecified: Secondary | ICD-10-CM | POA: Diagnosis not present

## 2018-09-22 DIAGNOSIS — L638 Other alopecia areata: Secondary | ICD-10-CM | POA: Diagnosis not present

## 2018-09-22 DIAGNOSIS — I10 Essential (primary) hypertension: Secondary | ICD-10-CM | POA: Diagnosis not present

## 2018-10-17 DIAGNOSIS — H401131 Primary open-angle glaucoma, bilateral, mild stage: Secondary | ICD-10-CM | POA: Diagnosis not present

## 2018-10-17 DIAGNOSIS — H5203 Hypermetropia, bilateral: Secondary | ICD-10-CM | POA: Diagnosis not present

## 2018-10-17 DIAGNOSIS — H31002 Unspecified chorioretinal scars, left eye: Secondary | ICD-10-CM | POA: Diagnosis not present

## 2018-10-17 DIAGNOSIS — H2513 Age-related nuclear cataract, bilateral: Secondary | ICD-10-CM | POA: Diagnosis not present

## 2018-10-17 DIAGNOSIS — H52223 Regular astigmatism, bilateral: Secondary | ICD-10-CM | POA: Diagnosis not present

## 2018-11-03 DIAGNOSIS — L638 Other alopecia areata: Secondary | ICD-10-CM | POA: Diagnosis not present

## 2018-11-03 DIAGNOSIS — I10 Essential (primary) hypertension: Secondary | ICD-10-CM | POA: Diagnosis not present

## 2018-11-24 ENCOUNTER — Ambulatory Visit (INDEPENDENT_AMBULATORY_CARE_PROVIDER_SITE_OTHER): Payer: Medicare HMO

## 2018-11-24 ENCOUNTER — Ambulatory Visit: Payer: Medicare HMO | Admitting: Podiatry

## 2018-11-24 ENCOUNTER — Other Ambulatory Visit: Payer: Self-pay | Admitting: Podiatry

## 2018-11-24 ENCOUNTER — Other Ambulatory Visit: Payer: Self-pay

## 2018-11-24 DIAGNOSIS — R600 Localized edema: Secondary | ICD-10-CM

## 2018-11-24 DIAGNOSIS — M79675 Pain in left toe(s): Secondary | ICD-10-CM | POA: Diagnosis not present

## 2018-11-24 DIAGNOSIS — S92301A Fracture of unspecified metatarsal bone(s), right foot, initial encounter for closed fracture: Secondary | ICD-10-CM

## 2018-11-24 DIAGNOSIS — S9032XA Contusion of left foot, initial encounter: Secondary | ICD-10-CM | POA: Diagnosis not present

## 2018-11-24 DIAGNOSIS — S9031XA Contusion of right foot, initial encounter: Secondary | ICD-10-CM

## 2018-12-08 ENCOUNTER — Other Ambulatory Visit: Payer: Self-pay | Admitting: Podiatry

## 2018-12-08 ENCOUNTER — Ambulatory Visit (INDEPENDENT_AMBULATORY_CARE_PROVIDER_SITE_OTHER): Payer: Medicare HMO

## 2018-12-08 ENCOUNTER — Ambulatory Visit (INDEPENDENT_AMBULATORY_CARE_PROVIDER_SITE_OTHER): Payer: Medicare HMO | Admitting: Podiatry

## 2018-12-08 ENCOUNTER — Other Ambulatory Visit: Payer: Self-pay

## 2018-12-08 DIAGNOSIS — M2041 Other hammer toe(s) (acquired), right foot: Secondary | ICD-10-CM | POA: Diagnosis not present

## 2018-12-08 DIAGNOSIS — M2042 Other hammer toe(s) (acquired), left foot: Secondary | ICD-10-CM

## 2018-12-08 DIAGNOSIS — M2012 Hallux valgus (acquired), left foot: Secondary | ICD-10-CM

## 2018-12-08 DIAGNOSIS — S92301A Fracture of unspecified metatarsal bone(s), right foot, initial encounter for closed fracture: Secondary | ICD-10-CM

## 2018-12-08 DIAGNOSIS — M2011 Hallux valgus (acquired), right foot: Secondary | ICD-10-CM | POA: Diagnosis not present

## 2018-12-08 DIAGNOSIS — M7752 Other enthesopathy of left foot: Secondary | ICD-10-CM

## 2018-12-08 NOTE — Progress Notes (Signed)
  Subjective:  Patient ID: Jasmine Stewart, female    DOB: [birth year],  MRN: [id number]  Chief Complaint  Patient presents with  . Foot Problem    i may have fractured the right foot    The patient complains that her left foot is swollen.  She is diabetic.  Her last A1c was 5.0.  She states that this started on Saturday.  Reports that it hurts on the side.  Reports it has been present for one week.  Reports sore, tenderness, and swelling.  Hurts some with tennis shoes.  Denies known injury.  Reports burning and throbbing without numbness or tingling.  Objective:  There were no vitals filed for this visit. General AA&O x3. Normal mood and affect.  Vascular Pedal pulses palpable.  Neurologic Epicritic sensation grossly intact.  Dermatologic No open lesions. Skin normal texture and turgor.  Orthopedic:  Pain to palpation about the left lateral ankle   Radiographs: Taken and reviewed no acute fracture dislocations prior hardware evident. Assessment/Plan:  Patient was evaluated and treated and all questions answered.  1. Contusion left foot, edema -  Unna boot applied from foot to ankle. -  CAM boot dispensed to the left foot. -  Follow up in two weeks for recheck and new x-rays.  No follow-ups on file.

## 2019-01-07 NOTE — Progress Notes (Signed)
    Subjective:  Patient ID: Jasmine Stewart, female    DOB: Sep 08, 1945,  MRN: [id number]  Chief Complaint  Patient presents with  . Foot Injury    Pt states healing well with no concerns.  . Foot Problem    Pt is interested in correction for 3rd toe.   The patient complains that her left foot is swollen.  States that the left foot is doing much better does not have pain or issues today  Objective:  There were no vitals filed for this visit. General AA&O x3. Normal mood and affect.  Vascular Pedal pulses palpable.  Neurologic Epicritic sensation grossly intact.  Dermatologic No open lesions. Skin normal texture and turgor.  Orthopedic:  No pain to palpation today right third toe hammertoe, second hammertoe hallux valgus right   Radiographs: Taken and reviewed no interval findings Assessment/Plan:  Patient was evaluated and treated and all questions answered.  Hammertoe right third toe with hallux valgus metatarsal deformity -Patient desired third hammertoe correction discussed that due to the extensive deformities she would require multiple procedures to fix this deformity patient was not interested in going through this.  Follow-up should pain persist  Return if symptoms worsen or fail to improve.

## 2019-01-12 ENCOUNTER — Other Ambulatory Visit: Payer: Self-pay | Admitting: Family Medicine

## 2019-01-12 DIAGNOSIS — Z1231 Encounter for screening mammogram for malignant neoplasm of breast: Secondary | ICD-10-CM

## 2019-01-19 DIAGNOSIS — E039 Hypothyroidism, unspecified: Secondary | ICD-10-CM | POA: Diagnosis not present

## 2019-01-19 DIAGNOSIS — D519 Vitamin B12 deficiency anemia, unspecified: Secondary | ICD-10-CM | POA: Diagnosis not present

## 2019-01-19 DIAGNOSIS — E119 Type 2 diabetes mellitus without complications: Secondary | ICD-10-CM | POA: Diagnosis not present

## 2019-01-19 DIAGNOSIS — L638 Other alopecia areata: Secondary | ICD-10-CM | POA: Diagnosis not present

## 2019-01-19 DIAGNOSIS — I10 Essential (primary) hypertension: Secondary | ICD-10-CM | POA: Diagnosis not present

## 2019-01-22 DIAGNOSIS — E669 Obesity, unspecified: Secondary | ICD-10-CM | POA: Diagnosis not present

## 2019-01-22 DIAGNOSIS — I10 Essential (primary) hypertension: Secondary | ICD-10-CM | POA: Diagnosis not present

## 2019-01-22 DIAGNOSIS — E1169 Type 2 diabetes mellitus with other specified complication: Secondary | ICD-10-CM | POA: Diagnosis not present

## 2019-02-28 ENCOUNTER — Ambulatory Visit
Admission: RE | Admit: 2019-02-28 | Discharge: 2019-02-28 | Disposition: A | Discharge status: Home / Self Care | Payer: Medicare HMO | Source: Ambulatory Visit | Attending: Family Medicine | Admitting: Family Medicine

## 2019-02-28 ENCOUNTER — Other Ambulatory Visit: Payer: Self-pay

## 2019-02-28 DIAGNOSIS — Z1231 Encounter for screening mammogram for malignant neoplasm of breast: Secondary | ICD-10-CM | POA: Diagnosis not present

## 2019-03-27 DIAGNOSIS — H31002 Unspecified chorioretinal scars, left eye: Secondary | ICD-10-CM | POA: Diagnosis not present

## 2019-03-27 DIAGNOSIS — H2513 Age-related nuclear cataract, bilateral: Secondary | ICD-10-CM | POA: Diagnosis not present

## 2019-03-27 DIAGNOSIS — H401131 Primary open-angle glaucoma, bilateral, mild stage: Secondary | ICD-10-CM | POA: Diagnosis not present

## 2019-04-20 DIAGNOSIS — E1169 Type 2 diabetes mellitus with other specified complication: Secondary | ICD-10-CM | POA: Diagnosis not present

## 2019-04-20 DIAGNOSIS — E782 Mixed hyperlipidemia: Secondary | ICD-10-CM | POA: Diagnosis not present

## 2019-04-20 DIAGNOSIS — I10 Essential (primary) hypertension: Secondary | ICD-10-CM | POA: Diagnosis not present

## 2019-04-20 DIAGNOSIS — E039 Hypothyroidism, unspecified: Secondary | ICD-10-CM | POA: Diagnosis not present

## 2019-04-20 DIAGNOSIS — E669 Obesity, unspecified: Secondary | ICD-10-CM | POA: Diagnosis not present

## 2019-05-25 DIAGNOSIS — I10 Essential (primary) hypertension: Secondary | ICD-10-CM | POA: Diagnosis not present

## 2019-05-25 DIAGNOSIS — L658 Other specified nonscarring hair loss: Secondary | ICD-10-CM | POA: Diagnosis not present

## 2019-05-25 DIAGNOSIS — E1169 Type 2 diabetes mellitus with other specified complication: Secondary | ICD-10-CM | POA: Diagnosis not present

## 2019-05-25 DIAGNOSIS — E039 Hypothyroidism, unspecified: Secondary | ICD-10-CM | POA: Diagnosis not present

## 2019-07-09 ENCOUNTER — Other Ambulatory Visit: Payer: Self-pay | Admitting: *Deleted

## 2019-07-09 NOTE — Patient Outreach (Addendum)
  [facility]) Care Management Chronic Special Needs Program    07/09/2019  Name: Jasmine Stewart, DOB: 1945/11/27  MRN: [id number]   Ms. Jasmine Stewart is enrolled in a chronic special needs plan for Diabetes.   A completed health risk assessment has been received from the client.   The client's individualized care plan was developed based on available data.  Plan:   Send unsuccessful outreach letter with a copy of individualized care plan to client  Send Forensic scientist  Send educational material:      Emmi - "Why taking your medication or drug as ordered is important"           "Low blood sugar in people with diabetes"  Send individualized care plan to provider   Chronic care management coordinator, Irving Shows [phone]), will attempt outreach in 2-4 months.   Francis Dowse, RN, MSN, CCM, CNS [facility] Management Clinical Nurse Specialist Office Number [phone]

## 2019-07-24 ENCOUNTER — Encounter: Payer: Self-pay | Admitting: *Deleted

## 2019-07-24 ENCOUNTER — Other Ambulatory Visit: Payer: HMO | Admitting: *Deleted

## 2019-07-24 NOTE — Patient Outreach (Signed)
[facility]) Care Management Chronic Special Needs Program  07/24/2019  Name: Jasmine Stewart DOB: 06-21-1945  MRN: [id number]  Ms. Jasmine Stewart is enrolled in a chronic special needs plan for Diabetes. Chronic Care Management Coordinator telephoned client to review health risk assessment and to develop individualized care plan.  Introduced the chronic care management program, importance of client participation, and taking their care plan to all provider appointments and inpatient facilities.  Reviewed the transition of care process and possible referral to community care management.  Subjective: Client reports she lives with daughter and grandson and is independent with ADL's and IADL's.  Client reports she did receive advanced directives packet in the mail and will decide if she wants to complete.  Client reports she does not have a glucometer and has not been instructed to check blood sugar, recent AIC 5.8.  Client states she is to follow up with primary care provider in June and discusses any health issues at visits. Client reports she is trying to be careful and mindful of carbohydrate intake.  Goals Addressed            This Visit's Progress   . COMPLETED:  Acknowledge receipt of Advanced Directive package       Client did receive in the mail. Contact your provider or RN Care Manager if you have questions.      . "to stay healthy as I can" (pt-stated)       Stay active, get outside in the sunshine daily Eat as healthy as possible including lean protein, fruits and vegetables with fiber at each meal Talk with your doctor about any health concerns Continue to follow up with primary care provider    . Client understands the importance of follow-up with providers by attending scheduled visits       Review of medical record indicates client has completed 4 visits in 2020. Remember the importance of keeping all follow up appointments with your provider. Client  reports she is to see primary care provider in June 2021.      Marland Kitchen Client will verbalize knowledge of self management of Hypertension as evidences by BP reading of 140/90 or less; or as defined by provider   On track    Review of medical record indicates blood pressure 126/71 on 11/03/2018.   Continue to take medications as prescribed. Follow a healthy low sodium diet Exercise more - follow your providers recommendations. Education - Use [facility] calendar to record your blood pressure and write down questions for your provider.     Marland Kitchen HEMOGLOBIN A1C < 7.0       Review of medical record indicates last A1C 5.8 on 04/20/2019 Continue diabetes self management actions:  Glucose monitoring per provider recommendation  Check feet daily  Visit provider every 3-6 months as directed  Hbg A1C level every 3-6 months.  Eye Exam yearly  Carbohydrate controlled meal planning  Taking diabetes medication as prescribed by provider  Physical activity      . Maintain timely refills of diabetic medication as prescribed within the year .   On track    Notify your assigned care manager if you have difficulty getting your medicine Take medications as prescribed. Follow up with your health care provider if you have any questions. Review of electronic medical record medication dispense report indicates client maintains timely refills of diabetic medications.      . Obtain annual  Lipid Profile, LDL-C   On track    Review of  medical record indicates HDL, LDL, total cholesterol and triglycerides (within normal limits) completed on 04/20/2019.   Monitor your diet to avoid saturated fats, trans-fats, and eat more fiber. Keep all follow up appointments with your provider and have lab work done as prescribed.    . Obtain Annual Eye (retinal)  Exam    On track    Client self reports completion of diabetic eye exam one time per year.   Review of medical record indicates eye exam completed on 03/27/19 It is  important to have an annual diabetes eye exam.     . Obtain Annual Foot Exam   On track    Client self reports foot checks are completed 2 times per year. Per medical record review, foot exam completed on 12/08/18 Your doctor should check your feet at least once a year. Plan to schedule a foot exam with your health care provider once every year. Check your skin and feet daily for cuts, bruises, redness, blisters or sore.       . Visit Primary Care Provider or Endocrinologist at least 2 times per year    On track    Client self reports completion of 2-3 visits each year with primary care provider. Review of medical record indicates client has completed 4 visits. Keep all follow up appointments with your providers.          PLAN:  RN care manager faxed letter to primary care provider including individualized care plan, ask for clarification from client request, is client supposed to check blood sugar.  RN care manager mailed successful outreach letter to client's home including individualized care plan and consent form.  Chronic care management coordination will outreach in:  9-12 months   Kassie Mends Nursing/RN [place] Case Manager, C-SNP  [phone]

## 2019-09-24 DIAGNOSIS — E039 Hypothyroidism, unspecified: Secondary | ICD-10-CM | POA: Diagnosis not present

## 2019-09-24 DIAGNOSIS — E1169 Type 2 diabetes mellitus with other specified complication: Secondary | ICD-10-CM | POA: Diagnosis not present

## 2019-09-24 DIAGNOSIS — R7309 Other abnormal glucose: Secondary | ICD-10-CM | POA: Diagnosis not present

## 2019-09-24 DIAGNOSIS — I10 Essential (primary) hypertension: Secondary | ICD-10-CM | POA: Diagnosis not present

## 2019-09-24 DIAGNOSIS — R799 Abnormal finding of blood chemistry, unspecified: Secondary | ICD-10-CM | POA: Diagnosis not present

## 2019-09-28 DIAGNOSIS — E1169 Type 2 diabetes mellitus with other specified complication: Secondary | ICD-10-CM | POA: Diagnosis not present

## 2019-09-28 DIAGNOSIS — I10 Essential (primary) hypertension: Secondary | ICD-10-CM | POA: Diagnosis not present

## 2019-09-28 DIAGNOSIS — K219 Gastro-esophageal reflux disease without esophagitis: Secondary | ICD-10-CM | POA: Diagnosis not present

## 2019-09-28 DIAGNOSIS — E785 Hyperlipidemia, unspecified: Secondary | ICD-10-CM | POA: Diagnosis not present

## 2019-11-06 ENCOUNTER — Other Ambulatory Visit: Payer: Self-pay

## 2019-12-11 DIAGNOSIS — I10 Essential (primary) hypertension: Secondary | ICD-10-CM | POA: Diagnosis not present

## 2019-12-11 DIAGNOSIS — E119 Type 2 diabetes mellitus without complications: Secondary | ICD-10-CM | POA: Diagnosis not present

## 2019-12-11 DIAGNOSIS — E039 Hypothyroidism, unspecified: Secondary | ICD-10-CM | POA: Diagnosis not present

## 2019-12-11 DIAGNOSIS — E785 Hyperlipidemia, unspecified: Secondary | ICD-10-CM | POA: Diagnosis not present

## 2020-01-10 DIAGNOSIS — E119 Type 2 diabetes mellitus without complications: Secondary | ICD-10-CM | POA: Diagnosis not present

## 2020-01-10 DIAGNOSIS — E039 Hypothyroidism, unspecified: Secondary | ICD-10-CM | POA: Diagnosis not present

## 2020-01-10 DIAGNOSIS — E785 Hyperlipidemia, unspecified: Secondary | ICD-10-CM | POA: Diagnosis not present

## 2020-01-10 DIAGNOSIS — I10 Essential (primary) hypertension: Secondary | ICD-10-CM | POA: Diagnosis not present

## 2020-01-16 ENCOUNTER — Other Ambulatory Visit: Payer: Self-pay | Admitting: *Deleted

## 2020-01-16 NOTE — Patient Outreach (Signed)
[facility]) Care Management Chronic Special Needs Program  01/16/2020  Name: Jasmine Stewart DOB: Apr 25, 1945  MRN: [id number]  Ms. Jasmine Stewart is enrolled in a chronic special needs plan for Diabetes. Reviewed and updated care plan.  Subjective: Client recently completed Health Risk Assessment and mentioned she would like assistance of [facility]TA health coach for weight loss and pharmacy assistance as she cannot afford januvia and lumigan eye drops. Client states she would like to lose 10-20 pounds. Client states januvia costs her 300$ per month and lumigan costs 168$ for 3 months.    Goals Addressed              This Visit's Progress   .  "to lose 10-20 pounds" (pt-stated)        Please discuss weight loss with your primary care provider HTA health coach will outreach you for weight loss strategies, reinforcement RN care manager provided education "Weight Loss Tips"     .  Client will verbalize knowledge of self management of Hypertension as evidences by BP reading of 140/90 or less; or as defined by provider        Plan to check blood pressure regularly.  If you do not have a B/P monitor (cuff), one can be provided to you.  Write results in your [employer] calendar (in the back section). Reviewed blood pressure medication from EMR. Take B/P medications as ordered.  Some may cause you to use the bathroom more. Plan to eat low salt and heart healthy meals full of fruits, vegetables, whole grains, lean protein and limit fat and sugars. Increase activity as tolerated. Reviewed lifestyle modification- smoking cessation, weight control and reducing stress.       Marland Kitchen  HEMOGLOBIN A1C < 7        Congratulations, your [facility] is below goal at 5.4 Your last documented AIC on 09/24/19.  Have your [facility] checked every 6 months if you are at goal or every 3 months if you are not at goal. Check blood sugars daily before eating with goal of 80-130.  You can also check 1 1/2  hours after eating with goal of 180 or less. Plan to eat low carbohydrate and low salt meals, watch portion sizes and avoid sugar sweetened drinks.  Discussed carbohydrate control meals. Reviewed signs and symptoms of hyperglycemia (high blood sugar) and hypoglycemia (low blood sugar) and actions to take. Review [employer] calendar (sent in the mail) for diabetes action plan in the back. Reviewed nutrition counseling benefit provided by [employer].  Will refer to [facility]TA Health Coach to assist with weight loss. Increase activity only if you are able to do it.  Follow doctor recommendations.         .  Maintain timely refills of diabetic medication as prescribed within the year .        Contact your RN care manager if you have questions about medicines. Medication review completed from EMR information. It is important to take your medications as prescribed. RN care manager referred client to pharmacist for information and assistance.     .  Obtain annual  Lipid Profile, LDL-C        Per medical record review, Lipid profile completed on 09/24/19 LDL= 57 The goal for LDL is less than 70mg /dl as you are at high risk for complications. Try to avoid saturated fats, trans-fats and eat more fiber. Plan to take statin (cholesterol) medicine as ordered.       Obtain Hemoglobin A1C at least 2 times per year        Per medical record last A1C - 5.8 completed on 04/20/2019 and 5.4 on 09/24/19. Continue to keep your follow up appointments with your provider and have lab as recommended by your provider.         Plan:    RN care manager faxed today's note and updated individualized care plan to primary care provider, mailed updated individualized care plan to client along with education article and successful outreach letter.  Chronic care management coordinator will outreach in:  4-5 months as previously scheduled (per Tier 1)  Will refer to:  [facility] health coach for weight loss  and HTA pharmacist for medication assistance.   Audrie Gallus Nursing/RN Coord [facility] Case Manager, C-SNP  [phone]  .

## 2020-01-21 ENCOUNTER — Other Ambulatory Visit: Payer: Self-pay | Admitting: Family Medicine

## 2020-01-21 DIAGNOSIS — Z1231 Encounter for screening mammogram for malignant neoplasm of breast: Secondary | ICD-10-CM

## 2020-01-22 DIAGNOSIS — I10 Essential (primary) hypertension: Secondary | ICD-10-CM | POA: Diagnosis not present

## 2020-01-22 DIAGNOSIS — E039 Hypothyroidism, unspecified: Secondary | ICD-10-CM | POA: Diagnosis not present

## 2020-01-22 DIAGNOSIS — E785 Hyperlipidemia, unspecified: Secondary | ICD-10-CM | POA: Diagnosis not present

## 2020-01-25 DIAGNOSIS — E1169 Type 2 diabetes mellitus with other specified complication: Secondary | ICD-10-CM | POA: Diagnosis not present

## 2020-01-25 DIAGNOSIS — I10 Essential (primary) hypertension: Secondary | ICD-10-CM | POA: Diagnosis not present

## 2020-02-09 DIAGNOSIS — I10 Essential (primary) hypertension: Secondary | ICD-10-CM | POA: Diagnosis not present

## 2020-02-09 DIAGNOSIS — E785 Hyperlipidemia, unspecified: Secondary | ICD-10-CM | POA: Diagnosis not present

## 2020-02-09 DIAGNOSIS — E039 Hypothyroidism, unspecified: Secondary | ICD-10-CM | POA: Diagnosis not present

## 2020-02-09 DIAGNOSIS — E119 Type 2 diabetes mellitus without complications: Secondary | ICD-10-CM | POA: Diagnosis not present

## 2020-02-29 DIAGNOSIS — E039 Hypothyroidism, unspecified: Secondary | ICD-10-CM | POA: Diagnosis not present

## 2020-02-29 DIAGNOSIS — L658 Other specified nonscarring hair loss: Secondary | ICD-10-CM | POA: Diagnosis not present

## 2020-02-29 DIAGNOSIS — E119 Type 2 diabetes mellitus without complications: Secondary | ICD-10-CM | POA: Diagnosis not present

## 2020-02-29 DIAGNOSIS — I10 Essential (primary) hypertension: Secondary | ICD-10-CM | POA: Diagnosis not present

## 2020-03-04 ENCOUNTER — Other Ambulatory Visit: Payer: Self-pay

## 2020-03-04 ENCOUNTER — Ambulatory Visit
Admission: RE | Admit: 2020-03-04 | Discharge: 2020-03-04 | Disposition: A | Discharge status: Home / Self Care | Payer: HMO | Source: Ambulatory Visit | Attending: Family Medicine | Admitting: Family Medicine

## 2020-03-04 DIAGNOSIS — Z1231 Encounter for screening mammogram for malignant neoplasm of breast: Secondary | ICD-10-CM | POA: Diagnosis not present

## 2020-03-11 DIAGNOSIS — E785 Hyperlipidemia, unspecified: Secondary | ICD-10-CM | POA: Diagnosis not present

## 2020-03-11 DIAGNOSIS — E119 Type 2 diabetes mellitus without complications: Secondary | ICD-10-CM | POA: Diagnosis not present

## 2020-03-11 DIAGNOSIS — E039 Hypothyroidism, unspecified: Secondary | ICD-10-CM | POA: Diagnosis not present

## 2020-03-11 DIAGNOSIS — I10 Essential (primary) hypertension: Secondary | ICD-10-CM | POA: Diagnosis not present

## 2020-03-26 ENCOUNTER — Other Ambulatory Visit: Payer: Self-pay | Admitting: *Deleted

## 2020-03-26 NOTE — Patient Outreach (Signed)
  [facility]) Care Management Chronic Special Needs Program    03/26/2020  Name: Jasmine Stewart, DOB: 06-29-1945  MRN: [id number]   Ms. Jasmine Stewart is enrolled in a chronic special needs plan for Diabetes.  [facility] care management will continue to provide services for this member through 04/11/20.  The [employer] care management team will assume care 04/12/20.   Irving Shows [facility], BSN [facility] RN Care Coordinator, CSNP [phone]

## 2020-04-11 DIAGNOSIS — I10 Essential (primary) hypertension: Secondary | ICD-10-CM | POA: Diagnosis not present

## 2020-04-11 DIAGNOSIS — E785 Hyperlipidemia, unspecified: Secondary | ICD-10-CM | POA: Diagnosis not present

## 2020-04-11 DIAGNOSIS — E039 Hypothyroidism, unspecified: Secondary | ICD-10-CM | POA: Diagnosis not present

## 2020-04-11 DIAGNOSIS — E119 Type 2 diabetes mellitus without complications: Secondary | ICD-10-CM | POA: Diagnosis not present

## 2020-04-17 ENCOUNTER — Other Ambulatory Visit: Payer: Self-pay | Admitting: *Deleted

## 2020-05-26 DIAGNOSIS — F064 Anxiety disorder due to known physiological condition: Secondary | ICD-10-CM | POA: Diagnosis not present

## 2020-05-26 DIAGNOSIS — K219 Gastro-esophageal reflux disease without esophagitis: Secondary | ICD-10-CM | POA: Diagnosis not present

## 2020-05-26 DIAGNOSIS — M130 Polyarthritis, unspecified: Secondary | ICD-10-CM | POA: Diagnosis not present

## 2020-05-26 DIAGNOSIS — E119 Type 2 diabetes mellitus without complications: Secondary | ICD-10-CM | POA: Diagnosis not present

## 2020-06-12 ENCOUNTER — Ambulatory Visit: Payer: HMO | Admitting: *Deleted

## 2020-06-17 DIAGNOSIS — F064 Anxiety disorder due to known physiological condition: Secondary | ICD-10-CM | POA: Diagnosis not present

## 2020-06-17 DIAGNOSIS — E785 Hyperlipidemia, unspecified: Secondary | ICD-10-CM | POA: Diagnosis not present

## 2020-06-17 DIAGNOSIS — E039 Hypothyroidism, unspecified: Secondary | ICD-10-CM | POA: Diagnosis not present

## 2020-06-17 DIAGNOSIS — I10 Essential (primary) hypertension: Secondary | ICD-10-CM | POA: Diagnosis not present

## 2020-06-17 DIAGNOSIS — E1169 Type 2 diabetes mellitus with other specified complication: Secondary | ICD-10-CM | POA: Diagnosis not present

## 2020-06-24 DIAGNOSIS — E785 Hyperlipidemia, unspecified: Secondary | ICD-10-CM | POA: Diagnosis not present

## 2020-06-24 DIAGNOSIS — F064 Anxiety disorder due to known physiological condition: Secondary | ICD-10-CM | POA: Diagnosis not present

## 2020-06-24 DIAGNOSIS — E1169 Type 2 diabetes mellitus with other specified complication: Secondary | ICD-10-CM | POA: Diagnosis not present

## 2020-06-24 DIAGNOSIS — E119 Type 2 diabetes mellitus without complications: Secondary | ICD-10-CM | POA: Diagnosis not present

## 2020-06-24 DIAGNOSIS — E669 Obesity, unspecified: Secondary | ICD-10-CM | POA: Diagnosis not present

## 2020-06-24 DIAGNOSIS — I10 Essential (primary) hypertension: Secondary | ICD-10-CM | POA: Diagnosis not present

## 2020-06-24 DIAGNOSIS — Z0000 Encounter for general adult medical examination without abnormal findings: Secondary | ICD-10-CM | POA: Diagnosis not present

## 2020-06-26 DIAGNOSIS — S83282A Other tear of lateral meniscus, current injury, left knee, initial encounter: Secondary | ICD-10-CM | POA: Diagnosis not present

## 2020-06-26 DIAGNOSIS — M25561 Pain in right knee: Secondary | ICD-10-CM | POA: Diagnosis not present

## 2020-07-10 DIAGNOSIS — E119 Type 2 diabetes mellitus without complications: Secondary | ICD-10-CM | POA: Diagnosis not present

## 2020-07-10 DIAGNOSIS — I10 Essential (primary) hypertension: Secondary | ICD-10-CM | POA: Diagnosis not present

## 2020-07-10 DIAGNOSIS — E785 Hyperlipidemia, unspecified: Secondary | ICD-10-CM | POA: Diagnosis not present

## 2020-07-22 DIAGNOSIS — H401131 Primary open-angle glaucoma, bilateral, mild stage: Secondary | ICD-10-CM | POA: Diagnosis not present

## 2020-07-24 ENCOUNTER — Ambulatory Visit (INDEPENDENT_AMBULATORY_CARE_PROVIDER_SITE_OTHER): Payer: HMO | Admitting: Podiatry

## 2020-07-24 ENCOUNTER — Ambulatory Visit (INDEPENDENT_AMBULATORY_CARE_PROVIDER_SITE_OTHER): Payer: HMO

## 2020-07-24 ENCOUNTER — Other Ambulatory Visit: Payer: Self-pay

## 2020-07-24 DIAGNOSIS — Z01818 Encounter for other preprocedural examination: Secondary | ICD-10-CM

## 2020-07-24 DIAGNOSIS — M21611 Bunion of right foot: Secondary | ICD-10-CM | POA: Diagnosis not present

## 2020-07-24 DIAGNOSIS — M2011 Hallux valgus (acquired), right foot: Secondary | ICD-10-CM

## 2020-07-24 DIAGNOSIS — M21619 Bunion of unspecified foot: Secondary | ICD-10-CM

## 2020-07-24 NOTE — Progress Notes (Signed)
Dg f 

## 2020-07-29 ENCOUNTER — Encounter: Payer: Self-pay | Admitting: Podiatry

## 2020-07-29 NOTE — Progress Notes (Signed)
Subjective:  Patient ID: Jasmine Stewart, female    DOB: 25-Jan-1946,  MRN: [id number]  No chief complaint on file.   75 y.o. female presents with the above complaint.  Patient presents with complaint of right moderate bunion deformity.  She states is painful to touch.  She is tried some various conservative treatment options which has helped.  She would like to discuss surgical options at this time.  She has not seen anyone else prior to see me in a long period of time.  She denies any other acute complaints.  There is no alleviating factor.  Pain scale 7 out of 10.  Shoe gear modification has not helped.  Review of Systems: Negative except as noted in the HPI. Denies N/V/F/Ch.  No past medical history on file.  Current Outpatient Medications:  .  atorvastatin (LIPITOR) 10 MG tablet, , Disp: , Rfl:  .  Cholecalciferol (VITAMIN D-1000 MAX ST) 25 MCG (1000 UT) tablet, Take by mouth., Disp: , Rfl:  .  fexofenadine (ALLEGRA) 180 MG tablet, TK 1 T PO D, Disp: , Rfl:  .  JANUVIA 100 MG tablet, TK 1 T PO QD, Disp: , Rfl:  .  levothyroxine (SYNTHROID) 50 MCG tablet, , Disp: , Rfl:  .  lisinopril-hydrochlorothiazide (ZESTORETIC) 20-25 MG tablet, , Disp: , Rfl:  .  LUMIGAN 0.01 % SOLN, , Disp: , Rfl:  .  metFORMIN (GLUCOPHAGE) 500 MG tablet, , Disp: , Rfl:   Social History   Tobacco Use  Smoking Status Not on file  Smokeless Tobacco Not on file    Allergies  Allergen Reactions  . Iohexol       Desc: ANAPHYLAXIS WITH IV CONTRAST!    Objective:  There were no vitals filed for this visit. There is no height or weight on file to calculate BMI. Constitutional Well developed. Well nourished.  Vascular Dorsalis pedis pulses palpable bilaterally. Posterior tibial pulses palpable bilaterally. Capillary refill normal to all digits.  No cyanosis or clubbing noted. Pedal hair growth normal.  Neurologic Normal speech. Oriented to person, place, and time. Epicritic sensation to light touch  grossly present bilaterally.  Dermatologic Nails well groomed and normal in appearance. No open wounds. No skin lesions.  Orthopedic: Normal joint ROM without pain or crepitus bilaterally. Normal joint ROM without pain or crepitus bilaterally. Hallux abductovalgus deformity present very mild in nature.  Mild pain on palpation.  Negative Tinel's sign.  No intra-articular pain.  This is a track bound not a tracking deformity Left 1st MPJ full range of motion. Left 1st TMT without gross hypermobility. Right 1st MPJ full range of motion  Right 1st TMT without gross hypermobility. Lesser digital contractures absent bilaterally.   Radiographs: Taken and reviewed. Hallux abductovalgus deformity present. Metatarsal parabola normal. 1st/2nd IMA: Moderate; TSP: 6 out of 7.  Hypertrophy of the medial eminence noted.  Hallux valgus angle increased.  Rotation noted to the hallux valgus as well.  Assessment:   1. Hallux abducto valgus, right   2. Hallux interphalangeus, acquired, right   3. Preoperative examination    Plan:  Patient was evaluated and treated and all questions answered.  Hallux abductovalgus deformity, right moderate -Clinically I evaluated the bunion deformity especially to the right side.  Patient has a moderate bunion deformity.  Given the type of shoes that she is wearing I discussed with her the importance of shoe gear modification.  However she has failed all conservative treatment options -Given that clinically patient has not had no  resolve meant at this time I discussed surgical option with her.  I discussed that she could benefit from correction of bunion and therefore relieving the pressure off of the nerve.  Patient states understanding would like to proceed with the bunion surgery as she has failed all conservative treatment options.  I discussed my preoperative plan as well as my postoperative protocol in extensive detail with the patient.  Patient understands and would  like to proceed with the surgery.  I plan on doing a chevron osteotomy with a possible phalangeal osteotomy with use of fixation. -She will be in a cam boot weightbearing as tolerated to about 6 weeks followed by transition into regular shoes. -Cam boot was dispensed -Informed surgical risk consent was reviewed and read aloud to the patient.I reviewed the films.I have discussed my findings with the patient in great detail.I have discussed all risks including but not limited to infection, stiffness, scarring, limp, disability, deformity, damage to blood vessels and nerves, numbness, poor healing, need for braces, arthritis, chronic pain, amputation, death.All benefits and realistic expectations discussed in great detail.I have made no promises as to the outcome.I have provided realistic expectations.I have offered the patient a 2nd opinion, which they have declined and assured me they preferred to proceed despite the risks -A total of 43 minutes was spent in direct patient care as well as pre and post patient encounter activities.  This includes documentation as well as reviewing patient chart for labs, imaging, past medical, surgical, social, and family history as documented in the EMR.  I have reviewed medication allergies as documented in EMR.  I discussed the etiology of condition and treatment options from conservative to surgical care.  All risks and benefit of the treatment course was discussed in detail.  All questions were answered and return appointment was discussed.  Since the visit completed in an ambulatory/outpatient setting, the patient and/or parent/guardian has been advised to contact the providers office for worsening condition and seek medical treatment and/or call 911 if the patient deems either is necessary. No follow-ups on file.

## 2020-08-07 DIAGNOSIS — S83282D Other tear of lateral meniscus, current injury, left knee, subsequent encounter: Secondary | ICD-10-CM | POA: Diagnosis not present

## 2020-08-09 DIAGNOSIS — I10 Essential (primary) hypertension: Secondary | ICD-10-CM | POA: Diagnosis not present

## 2020-08-09 DIAGNOSIS — E785 Hyperlipidemia, unspecified: Secondary | ICD-10-CM | POA: Diagnosis not present

## 2020-08-09 DIAGNOSIS — E119 Type 2 diabetes mellitus without complications: Secondary | ICD-10-CM | POA: Diagnosis not present

## 2020-09-01 ENCOUNTER — Telehealth: Payer: Self-pay | Admitting: Urology

## 2020-09-01 NOTE — Telephone Encounter (Signed)
DOS - 09/22/20  [place] BUNIONECTOMY RIGHT --- [id number] DOUBLE OSTEOTOMY RIGHT --- [id number]  [facility] EFFECTIVE DATE - 04/12/20  RECEIVED FAX FROM [facility] STATING THAT CPT CODES 46803 AND 28299 HAVE BEEN APPROVED, AUTH #[id number], GOOD FROM 09/22/20 - 12/21/20.

## 2020-09-09 DIAGNOSIS — E785 Hyperlipidemia, unspecified: Secondary | ICD-10-CM | POA: Diagnosis not present

## 2020-09-09 DIAGNOSIS — E039 Hypothyroidism, unspecified: Secondary | ICD-10-CM | POA: Diagnosis not present

## 2020-09-09 DIAGNOSIS — E119 Type 2 diabetes mellitus without complications: Secondary | ICD-10-CM | POA: Diagnosis not present

## 2020-09-09 DIAGNOSIS — I10 Essential (primary) hypertension: Secondary | ICD-10-CM | POA: Diagnosis not present

## 2020-09-10 DIAGNOSIS — I10 Essential (primary) hypertension: Secondary | ICD-10-CM | POA: Diagnosis not present

## 2020-09-10 DIAGNOSIS — M130 Polyarthritis, unspecified: Secondary | ICD-10-CM | POA: Diagnosis not present

## 2020-09-10 DIAGNOSIS — E785 Hyperlipidemia, unspecified: Secondary | ICD-10-CM | POA: Diagnosis not present

## 2020-09-10 DIAGNOSIS — F064 Anxiety disorder due to known physiological condition: Secondary | ICD-10-CM | POA: Diagnosis not present

## 2020-09-10 DIAGNOSIS — E119 Type 2 diabetes mellitus without complications: Secondary | ICD-10-CM | POA: Diagnosis not present

## 2020-09-15 DIAGNOSIS — E1169 Type 2 diabetes mellitus with other specified complication: Secondary | ICD-10-CM | POA: Diagnosis not present

## 2020-09-15 DIAGNOSIS — E039 Hypothyroidism, unspecified: Secondary | ICD-10-CM | POA: Diagnosis not present

## 2020-09-15 DIAGNOSIS — I10 Essential (primary) hypertension: Secondary | ICD-10-CM | POA: Diagnosis not present

## 2020-09-15 DIAGNOSIS — E785 Hyperlipidemia, unspecified: Secondary | ICD-10-CM | POA: Diagnosis not present

## 2020-09-22 ENCOUNTER — Other Ambulatory Visit: Payer: Self-pay | Admitting: Podiatry

## 2020-09-22 ENCOUNTER — Encounter: Payer: Self-pay | Admitting: Podiatry

## 2020-09-22 DIAGNOSIS — M25571 Pain in right ankle and joints of right foot: Secondary | ICD-10-CM | POA: Diagnosis not present

## 2020-09-22 DIAGNOSIS — M2041 Other hammer toe(s) (acquired), right foot: Secondary | ICD-10-CM | POA: Diagnosis not present

## 2020-09-22 DIAGNOSIS — M2011 Hallux valgus (acquired), right foot: Secondary | ICD-10-CM | POA: Diagnosis not present

## 2020-09-22 DIAGNOSIS — M21611 Bunion of right foot: Secondary | ICD-10-CM | POA: Diagnosis not present

## 2020-09-22 DIAGNOSIS — M205X1 Other deformities of toe(s) (acquired), right foot: Secondary | ICD-10-CM | POA: Diagnosis not present

## 2020-09-22 MED ORDER — OXYCODONE-ACETAMINOPHEN 5-325 MG PO TABS: 1.0000 | ORAL_TABLET | ORAL | 0 refills | Status: AC | PRN

## 2020-09-22 MED ORDER — IBUPROFEN 800 MG PO TABS: 800.0000 mg | ORAL_TABLET | Freq: Four times a day (QID) | ORAL | 1 refills | Status: AC | PRN

## 2020-09-22 NOTE — Progress Notes (Unsigned)
per

## 2020-09-23 ENCOUNTER — Telehealth: Payer: Self-pay | Admitting: *Deleted

## 2020-09-23 NOTE — Telephone Encounter (Signed)
Patient's daughter([phone]) is calling with concerns about her mother 1 day after surgery. She is vomiting w/ everything that she eats. Please advise.

## 2020-09-24 ENCOUNTER — Other Ambulatory Visit: Payer: Self-pay | Admitting: Podiatry

## 2020-09-24 MED ORDER — HYDROCODONE-ACETAMINOPHEN 5-325 MG PO TABS: 1.0000 | ORAL_TABLET | Freq: Four times a day (QID) | ORAL | 0 refills | Status: AC | PRN

## 2020-09-24 NOTE — Telephone Encounter (Signed)
Returned call to patient's daughter and informed that the medication had been sent to pharmacy, she said that she had picked up pain medicine but nothing for nausea although patient has not been throwing up today.  I told her that I will check with doctor to see if he still wants to order Zofran.Please advise.

## 2020-09-24 NOTE — Progress Notes (Unsigned)
o

## 2020-09-25 ENCOUNTER — Telehealth: Payer: Self-pay | Admitting: *Deleted

## 2020-09-25 NOTE — Telephone Encounter (Signed)
Returned call to patient's daughter and she stated that the patient is doing ok with new medicine,not vomiting any since that one day.instructed to call back if any changes.

## 2020-09-25 NOTE — Telephone Encounter (Signed)
Patient's daughter will call us if she needs the Zofran medication

## 2020-10-01 ENCOUNTER — Ambulatory Visit (INDEPENDENT_AMBULATORY_CARE_PROVIDER_SITE_OTHER): Payer: HMO | Admitting: Podiatry

## 2020-10-01 ENCOUNTER — Ambulatory Visit (INDEPENDENT_AMBULATORY_CARE_PROVIDER_SITE_OTHER): Payer: HMO

## 2020-10-01 ENCOUNTER — Other Ambulatory Visit: Payer: Self-pay

## 2020-10-01 DIAGNOSIS — M2011 Hallux valgus (acquired), right foot: Secondary | ICD-10-CM | POA: Diagnosis not present

## 2020-10-01 DIAGNOSIS — Z9889 Other specified postprocedural states: Secondary | ICD-10-CM

## 2020-10-01 NOTE — Progress Notes (Signed)
  Subjective:  Patient ID: Jasmine Stewart, female    DOB: 04-18-1945,  MRN: [id number]  No chief complaint on file.   DOS: 09/22/2020 Procedure: Right bunionectomy with chevron osteotomy and Akin osteotomy  75 y.o. female returns for post-op check.  Patient states she is doing well.  Mild pain.  No fever nausea chills vomiting.  She has been ambulating with a cam boot.  She denies any other acute complaints.  Bandages are clean dry and intact  Review of Systems: Negative except as noted in the HPI. Denies N/V/F/Ch.  No past medical history on file.  Current Outpatient Medications:    atorvastatin (LIPITOR) 10 MG tablet, , Disp: , Rfl:    Cholecalciferol (VITAMIN D-1000 MAX ST) 25 MCG (1000 UT) tablet, Take by mouth., Disp: , Rfl:    fexofenadine (ALLEGRA) 180 MG tablet, TK 1 T PO D, Disp: , Rfl:    HYDROcodone-acetaminophen (NORCO) 5-325 MG tablet, Take 1 tablet by mouth every 6 (six) hours as needed for moderate pain., Disp: 30 tablet, Rfl: 0   ibuprofen (ADVIL) 800 MG tablet, Take 1 tablet (800 mg total) by mouth every 6 (six) hours as needed., Disp: 60 tablet, Rfl: 1   JANUVIA 100 MG tablet, TK 1 T PO QD, Disp: , Rfl:    levothyroxine (SYNTHROID) 50 MCG tablet, , Disp: , Rfl:    lisinopril-hydrochlorothiazide (ZESTORETIC) 20-25 MG tablet, , Disp: , Rfl:    LUMIGAN 0.01 % SOLN, , Disp: , Rfl:    metFORMIN (GLUCOPHAGE) 500 MG tablet, , Disp: , Rfl:    oxyCODONE-acetaminophen (PERCOCET) 5-325 MG tablet, Take 1-2 tablets by mouth every 4 (four) hours as needed for severe pain., Disp: 30 tablet, Rfl: 0  Social History   Tobacco Use  Smoking Status Not on file  Smokeless Tobacco Not on file    Allergies  Allergen Reactions   Iohexol       Desc: ANAPHYLAXIS WITH IV CONTRAST!    Objective:  There were no vitals filed for this visit. There is no height or weight on file to calculate BMI. Constitutional Well developed. Well nourished.  Vascular Foot warm and well  perfused. Capillary refill normal to all digits.   Neurologic Normal speech. Oriented to person, place, and time. Epicritic sensation to light touch grossly present bilaterally.  Dermatologic Skin healing well without signs of infection. Skin edges well coapted without signs of infection.  Orthopedic: Tenderness to palpation noted about the surgical site.   Radiographs: 3 views of skeletally mature the right foot: Good correction alignment noted.  Sesamoid reduction noted.  No backing out or loosening of the hardware noted. Assessment:   1. Hallux abducto valgus, right   2. Hallux interphalangeus, acquired, right   3. Status post foot surgery    Plan:  Patient was evaluated and treated and all questions answered.  S/p foot surgery right -Progressing as expected post-operatively. -XR: See above -WB Status: Weightbearing as tolerated in cam boot in  -Sutures: Within normal limits.  No clinical signs of dehiscence to report.  No infection noted -Medications: None -Foot redressed.  No follow-ups on file.

## 2020-10-07 ENCOUNTER — Encounter: Payer: Self-pay | Admitting: Podiatry

## 2020-10-09 DIAGNOSIS — E785 Hyperlipidemia, unspecified: Secondary | ICD-10-CM | POA: Diagnosis not present

## 2020-10-09 DIAGNOSIS — I10 Essential (primary) hypertension: Secondary | ICD-10-CM | POA: Diagnosis not present

## 2020-10-09 DIAGNOSIS — E039 Hypothyroidism, unspecified: Secondary | ICD-10-CM | POA: Diagnosis not present

## 2020-10-09 DIAGNOSIS — E119 Type 2 diabetes mellitus without complications: Secondary | ICD-10-CM | POA: Diagnosis not present

## 2020-10-15 ENCOUNTER — Encounter: Payer: HMO | Admitting: Podiatry

## 2020-10-22 ENCOUNTER — Ambulatory Visit (INDEPENDENT_AMBULATORY_CARE_PROVIDER_SITE_OTHER): Payer: HMO | Admitting: Podiatry

## 2020-10-22 ENCOUNTER — Other Ambulatory Visit: Payer: Self-pay

## 2020-10-22 DIAGNOSIS — M2011 Hallux valgus (acquired), right foot: Secondary | ICD-10-CM

## 2020-10-22 DIAGNOSIS — Z9889 Other specified postprocedural states: Secondary | ICD-10-CM

## 2020-10-28 ENCOUNTER — Encounter: Payer: Self-pay | Admitting: Podiatry

## 2020-10-28 NOTE — Progress Notes (Signed)
  Subjective:  Patient ID: Jasmine Stewart, female    DOB: 18-Jan-1946,  MRN: [id number]  Chief Complaint  Patient presents with   Routine Post Op    POST OP DOS 6.13.22     DOS: 09/22/2020 Procedure: Right bunionectomy with chevron osteotomy and Akin osteotomy  75 y.o. female returns for post-op check.  Patient states she is doing well.  Mild pain.  No fever nausea chills vomiting.  She has been ambulating with a cam boot.  She denies any other acute complaints.  Bandages are clean dry and intact  Review of Systems: Negative except as noted in the HPI. Denies N/V/F/Ch.  No past medical history on file.  Current Outpatient Medications:    atorvastatin (LIPITOR) 10 MG tablet, , Disp: , Rfl:    Cholecalciferol (VITAMIN D-1000 MAX ST) 25 MCG (1000 UT) tablet, Take by mouth., Disp: , Rfl:    fexofenadine (ALLEGRA) 180 MG tablet, TK 1 T PO D, Disp: , Rfl:    HYDROcodone-acetaminophen (NORCO) 5-325 MG tablet, Take 1 tablet by mouth every 6 (six) hours as needed for moderate pain., Disp: 30 tablet, Rfl: 0   ibuprofen (ADVIL) 800 MG tablet, Take 1 tablet (800 mg total) by mouth every 6 (six) hours as needed., Disp: 60 tablet, Rfl: 1   JANUVIA 100 MG tablet, TK 1 T PO QD, Disp: , Rfl:    levothyroxine (SYNTHROID) 50 MCG tablet, , Disp: , Rfl:    lisinopril-hydrochlorothiazide (ZESTORETIC) 20-25 MG tablet, , Disp: , Rfl:    LUMIGAN 0.01 % SOLN, , Disp: , Rfl:    metFORMIN (GLUCOPHAGE) 500 MG tablet, , Disp: , Rfl:    oxyCODONE-acetaminophen (PERCOCET) 5-325 MG tablet, Take 1-2 tablets by mouth every 4 (four) hours as needed for severe pain., Disp: 30 tablet, Rfl: 0  Social History   Tobacco Use  Smoking Status Not on file  Smokeless Tobacco Not on file    Allergies  Allergen Reactions   Iohexol       Desc: ANAPHYLAXIS WITH IV CONTRAST!    Objective:  There were no vitals filed for this visit. There is no height or weight on file to calculate BMI. Constitutional Well  developed. Well nourished.  Vascular Foot warm and well perfused. Capillary refill normal to all digits.   Neurologic Normal speech. Oriented to person, place, and time. Epicritic sensation to light touch grossly present bilaterally.  Dermatologic Skin completely epithelialized.  No clinical signs of infection.  Good range of motion noted at the first metatarsophalangeal joint.  Orthopedic: Mild tenderness to palpation noted about the surgical site.   Radiographs: 3 views of skeletally mature the right foot: Good correction alignment noted.  Sesamoid reduction noted.  No backing out or loosening of the hardware noted. Assessment:   1. Hallux abducto valgus, right   2. Hallux interphalangeus, acquired, right   3. Status post foot surgery     Plan:  Patient was evaluated and treated and all questions answered.  S/p foot surgery right -Progressing as expected post-operatively. -XR: See above -WB Status: Transition to surgical shoe weightbearing as tolerated -Sutures: Removed no clinical signs of dehiscence to report.  No infection noted -Medications: None -I discussed with her to undergo range of motion exercises first metatarsophalangeal joint prevent stiff joint  No follow-ups on file.

## 2020-11-09 DIAGNOSIS — E119 Type 2 diabetes mellitus without complications: Secondary | ICD-10-CM | POA: Diagnosis not present

## 2020-11-09 DIAGNOSIS — I10 Essential (primary) hypertension: Secondary | ICD-10-CM | POA: Diagnosis not present

## 2020-11-09 DIAGNOSIS — E039 Hypothyroidism, unspecified: Secondary | ICD-10-CM | POA: Diagnosis not present

## 2020-11-09 DIAGNOSIS — E785 Hyperlipidemia, unspecified: Secondary | ICD-10-CM | POA: Diagnosis not present

## 2020-11-18 DIAGNOSIS — H401131 Primary open-angle glaucoma, bilateral, mild stage: Secondary | ICD-10-CM | POA: Diagnosis not present

## 2020-11-18 DIAGNOSIS — E119 Type 2 diabetes mellitus without complications: Secondary | ICD-10-CM | POA: Diagnosis not present

## 2020-11-18 DIAGNOSIS — H2513 Age-related nuclear cataract, bilateral: Secondary | ICD-10-CM | POA: Diagnosis not present

## 2020-11-19 ENCOUNTER — Other Ambulatory Visit: Payer: Self-pay

## 2020-11-19 ENCOUNTER — Ambulatory Visit (INDEPENDENT_AMBULATORY_CARE_PROVIDER_SITE_OTHER): Payer: HMO | Admitting: Podiatry

## 2020-11-19 DIAGNOSIS — M2011 Hallux valgus (acquired), right foot: Secondary | ICD-10-CM

## 2020-11-19 DIAGNOSIS — Z9889 Other specified postprocedural states: Secondary | ICD-10-CM

## 2020-11-19 DIAGNOSIS — Q666 Other congenital valgus deformities of feet: Secondary | ICD-10-CM | POA: Diagnosis not present

## 2020-11-19 NOTE — Progress Notes (Signed)
Subjective:  Patient ID: Jasmine Stewart, female    DOB: 1945/06/17,  MRN: [id number]  Chief Complaint  Patient presents with   Routine Post Op    POV #3 DOS 09/22/2020 RT CORRECTION OF BUNION DEFORMITY W/PHALANGEAL OSTEOTOMY W/FIXATION     DOS: 09/22/2020 Procedure: Right bunionectomy with chevron osteotomy and Akin osteotomy  75 y.o. female returns for post-op check.  Patient states she is doing well.  Mild pain.  No fever nausea chills vomiting.  She is ambulating in regular shoes without complaints.  She is also here to discuss orthotics for flatfoot.  She denies any other acute complaints  Review of Systems: Negative except as noted in the HPI. Denies N/V/F/Ch.  No past medical history on file.  Current Outpatient Medications:    atorvastatin (LIPITOR) 10 MG tablet, , Disp: , Rfl:    Cholecalciferol (VITAMIN D-1000 MAX ST) 25 MCG (1000 UT) tablet, Take by mouth., Disp: , Rfl:    fexofenadine (ALLEGRA) 180 MG tablet, TK 1 T PO D, Disp: , Rfl:    HYDROcodone-acetaminophen (NORCO) 5-325 MG tablet, Take 1 tablet by mouth every 6 (six) hours as needed for moderate pain., Disp: 30 tablet, Rfl: 0   ibuprofen (ADVIL) 800 MG tablet, Take 1 tablet (800 mg total) by mouth every 6 (six) hours as needed., Disp: 60 tablet, Rfl: 1   JANUVIA 100 MG tablet, TK 1 T PO QD, Disp: , Rfl:    levothyroxine (SYNTHROID) 50 MCG tablet, , Disp: , Rfl:    lisinopril-hydrochlorothiazide (ZESTORETIC) 20-25 MG tablet, , Disp: , Rfl:    LUMIGAN 0.01 % SOLN, , Disp: , Rfl:    metFORMIN (GLUCOPHAGE) 500 MG tablet, , Disp: , Rfl:    oxyCODONE-acetaminophen (PERCOCET) 5-325 MG tablet, Take 1-2 tablets by mouth every 4 (four) hours as needed for severe pain., Disp: 30 tablet, Rfl: 0  Social History   Tobacco Use  Smoking Status Not on file  Smokeless Tobacco Not on file    Allergies  Allergen Reactions   Iohexol       Desc: ANAPHYLAXIS WITH IV CONTRAST!    Objective:  There were no vitals filed for  this visit. There is no height or weight on file to calculate BMI. Constitutional Well developed. Well nourished.  Vascular Foot warm and well perfused. Capillary refill normal to all digits.   Neurologic Normal speech. Oriented to person, place, and time. Epicritic sensation to light touch grossly present bilaterally.  Dermatologic Skin completely epithelialized.  No clinical signs of infection.  Good range of motion noted at the first metatarsophalangeal joint.  Orthopedic: Mild tenderness to palpation noted about the surgical site.  Pes planovalgus valgus foot structure noted with calcaneovalgus to many toe signs unable to partially recruit the arch with dorsiflexion hallux.  Rigid foot structure noted.   Radiographs: 3 views of skeletally mature the right foot: Good correction alignment noted.  Sesamoid reduction noted.  No backing out or loosening of the hardware noted. Assessment:   1. Pes planovalgus      Plan:  Patient was evaluated and treated and all questions answered.  S/p foot surgery right -Clinically healed and I discussed shoe gear modification as well as orthotics management.  At this time patient is officially discharged from my care if any foot and ankle issues arises in the future come back and see me.  She states understanding   Pes planovalgus -I explained to the patient the etiology of pes planovalgus and various treatment options were  discussed.  Given that she has underlying flatfoot structure I believe she would benefit from custom-made orthotics to help control hindfoot motion support the arches of her foot take the stress off from the arch.  Patient states that understanding -She was casted for orthotics  No follow-ups on file.

## 2020-11-20 ENCOUNTER — Telehealth: Payer: Self-pay | Admitting: Podiatry

## 2020-11-20 NOTE — Telephone Encounter (Signed)
Faxed [employer] auth for orthotics.Marland Kitchen

## 2020-11-24 ENCOUNTER — Ambulatory Visit (INDEPENDENT_AMBULATORY_CARE_PROVIDER_SITE_OTHER): Payer: HMO

## 2020-11-24 ENCOUNTER — Telehealth: Payer: Self-pay | Admitting: Podiatry

## 2020-11-24 ENCOUNTER — Other Ambulatory Visit: Payer: Self-pay

## 2020-11-24 DIAGNOSIS — Q666 Other congenital valgus deformities of feet: Secondary | ICD-10-CM

## 2020-11-24 NOTE — Progress Notes (Signed)
Patient in office today for orthotic casting. Patient is 5'1" and 189lbs. Patient was casted with foam box without complication. Patient shoe size is 7.5 wide in a women's shoe. Advised patient the office will call when orthotics are available for pick-up. Patient verbalized understanding.

## 2020-11-24 NOTE — Telephone Encounter (Signed)
Received faxed authorization  # [phone] for pt for orthotics([id number]) valid 8.16.2022 thru 11.14.2022

## 2020-12-10 DIAGNOSIS — E039 Hypothyroidism, unspecified: Secondary | ICD-10-CM | POA: Diagnosis not present

## 2020-12-10 DIAGNOSIS — I10 Essential (primary) hypertension: Secondary | ICD-10-CM | POA: Diagnosis not present

## 2020-12-10 DIAGNOSIS — E785 Hyperlipidemia, unspecified: Secondary | ICD-10-CM | POA: Diagnosis not present

## 2020-12-10 DIAGNOSIS — E119 Type 2 diabetes mellitus without complications: Secondary | ICD-10-CM | POA: Diagnosis not present

## 2021-01-05 ENCOUNTER — Ambulatory Visit (INDEPENDENT_AMBULATORY_CARE_PROVIDER_SITE_OTHER): Payer: HMO | Admitting: Podiatry

## 2021-01-05 ENCOUNTER — Other Ambulatory Visit: Payer: Self-pay

## 2021-01-05 DIAGNOSIS — Q666 Other congenital valgus deformities of feet: Secondary | ICD-10-CM | POA: Diagnosis not present

## 2021-01-05 DIAGNOSIS — Z9889 Other specified postprocedural states: Secondary | ICD-10-CM

## 2021-01-05 DIAGNOSIS — M2011 Hallux valgus (acquired), right foot: Secondary | ICD-10-CM

## 2021-01-05 NOTE — Progress Notes (Signed)
Patient presents today for orthotic pick up. Patient voices no new complaints.  Orthotics were fitted to patient's feet. No discomfort and no rubbing. Patient satisfied with the orthotics.  Orthotics were dispensed to patient with instructions for break in wear and to call the office with any concerns or questions. 

## 2021-01-05 NOTE — Patient Instructions (Signed)

## 2021-01-09 DIAGNOSIS — E039 Hypothyroidism, unspecified: Secondary | ICD-10-CM | POA: Diagnosis not present

## 2021-01-09 DIAGNOSIS — I10 Essential (primary) hypertension: Secondary | ICD-10-CM | POA: Diagnosis not present

## 2021-01-09 DIAGNOSIS — E119 Type 2 diabetes mellitus without complications: Secondary | ICD-10-CM | POA: Diagnosis not present

## 2021-01-09 DIAGNOSIS — E785 Hyperlipidemia, unspecified: Secondary | ICD-10-CM | POA: Diagnosis not present

## 2021-01-21 ENCOUNTER — Other Ambulatory Visit: Payer: Self-pay

## 2021-01-21 ENCOUNTER — Ambulatory Visit (INDEPENDENT_AMBULATORY_CARE_PROVIDER_SITE_OTHER): Payer: HMO | Admitting: *Deleted

## 2021-01-21 DIAGNOSIS — Z23 Encounter for immunization: Secondary | ICD-10-CM | POA: Diagnosis not present

## 2021-01-21 DIAGNOSIS — E1169 Type 2 diabetes mellitus with other specified complication: Secondary | ICD-10-CM | POA: Diagnosis not present

## 2021-01-21 DIAGNOSIS — I10 Essential (primary) hypertension: Secondary | ICD-10-CM | POA: Diagnosis not present

## 2021-01-21 DIAGNOSIS — Q666 Other congenital valgus deformities of feet: Secondary | ICD-10-CM

## 2021-01-21 DIAGNOSIS — E785 Hyperlipidemia, unspecified: Secondary | ICD-10-CM | POA: Diagnosis not present

## 2021-01-21 DIAGNOSIS — E039 Hypothyroidism, unspecified: Secondary | ICD-10-CM | POA: Diagnosis not present

## 2021-01-21 NOTE — Progress Notes (Signed)
Patient presents today with concerns of how the custom molded foot orthotics fit.  She has been trying to wear them in multiple style shoes and it seems tight and the edges are wrinkling up inside the shoe.   On evaluation, she hasn't been taking out the regular insole of the shoe, which has been causing some of the tightness and fitting issues.   I advised taking all insoles or liners out of any shoe she will be using these orthotics in. She demonstrates understanding. I did trim a little off the tip of the toe, which she said helped a lot as well. The shell is a little wide and explained if there were still problems fitting into other shoes, we may have to send back to the lab for a shell width adjustment.   Patient will follow up as needed.

## 2021-01-23 ENCOUNTER — Other Ambulatory Visit: Payer: Self-pay | Admitting: Family Medicine

## 2021-01-23 DIAGNOSIS — Z1231 Encounter for screening mammogram for malignant neoplasm of breast: Secondary | ICD-10-CM

## 2021-01-26 DIAGNOSIS — E039 Hypothyroidism, unspecified: Secondary | ICD-10-CM | POA: Diagnosis not present

## 2021-01-26 DIAGNOSIS — T7840XA Allergy, unspecified, initial encounter: Secondary | ICD-10-CM | POA: Diagnosis not present

## 2021-01-26 DIAGNOSIS — I10 Essential (primary) hypertension: Secondary | ICD-10-CM | POA: Diagnosis not present

## 2021-01-26 DIAGNOSIS — E1169 Type 2 diabetes mellitus with other specified complication: Secondary | ICD-10-CM | POA: Diagnosis not present

## 2021-02-09 DIAGNOSIS — E785 Hyperlipidemia, unspecified: Secondary | ICD-10-CM | POA: Diagnosis not present

## 2021-02-09 DIAGNOSIS — E039 Hypothyroidism, unspecified: Secondary | ICD-10-CM | POA: Diagnosis not present

## 2021-02-09 DIAGNOSIS — E119 Type 2 diabetes mellitus without complications: Secondary | ICD-10-CM | POA: Diagnosis not present

## 2021-02-09 DIAGNOSIS — I10 Essential (primary) hypertension: Secondary | ICD-10-CM | POA: Diagnosis not present

## 2021-03-09 ENCOUNTER — Other Ambulatory Visit: Payer: Self-pay

## 2021-03-09 ENCOUNTER — Ambulatory Visit
Admission: RE | Admit: 2021-03-09 | Discharge: 2021-03-09 | Disposition: A | Discharge status: Home / Self Care | Payer: HMO | Source: Ambulatory Visit | Attending: Family Medicine | Admitting: Family Medicine

## 2021-03-09 DIAGNOSIS — Z1231 Encounter for screening mammogram for malignant neoplasm of breast: Secondary | ICD-10-CM

## 2021-03-11 DIAGNOSIS — I10 Essential (primary) hypertension: Secondary | ICD-10-CM | POA: Diagnosis not present

## 2021-03-11 DIAGNOSIS — E119 Type 2 diabetes mellitus without complications: Secondary | ICD-10-CM | POA: Diagnosis not present

## 2021-03-11 DIAGNOSIS — E039 Hypothyroidism, unspecified: Secondary | ICD-10-CM | POA: Diagnosis not present

## 2021-03-18 DIAGNOSIS — Z780 Asymptomatic menopausal state: Secondary | ICD-10-CM | POA: Diagnosis not present

## 2021-03-24 DIAGNOSIS — E119 Type 2 diabetes mellitus without complications: Secondary | ICD-10-CM | POA: Diagnosis not present

## 2021-03-24 DIAGNOSIS — H2513 Age-related nuclear cataract, bilateral: Secondary | ICD-10-CM | POA: Diagnosis not present

## 2021-03-24 DIAGNOSIS — H401131 Primary open-angle glaucoma, bilateral, mild stage: Secondary | ICD-10-CM | POA: Diagnosis not present

## 2021-05-26 DIAGNOSIS — E1169 Type 2 diabetes mellitus with other specified complication: Secondary | ICD-10-CM | POA: Diagnosis not present

## 2021-05-26 DIAGNOSIS — E785 Hyperlipidemia, unspecified: Secondary | ICD-10-CM | POA: Diagnosis not present

## 2021-05-26 DIAGNOSIS — I10 Essential (primary) hypertension: Secondary | ICD-10-CM | POA: Diagnosis not present

## 2021-05-29 DIAGNOSIS — E785 Hyperlipidemia, unspecified: Secondary | ICD-10-CM | POA: Diagnosis not present

## 2021-05-29 DIAGNOSIS — E668 Other obesity: Secondary | ICD-10-CM | POA: Diagnosis not present

## 2021-05-29 DIAGNOSIS — N189 Chronic kidney disease, unspecified: Secondary | ICD-10-CM | POA: Diagnosis not present

## 2021-05-29 DIAGNOSIS — I10 Essential (primary) hypertension: Secondary | ICD-10-CM | POA: Diagnosis not present

## 2021-05-29 DIAGNOSIS — G5603 Carpal tunnel syndrome, bilateral upper limbs: Secondary | ICD-10-CM | POA: Diagnosis not present

## 2021-06-05 DIAGNOSIS — Z1211 Encounter for screening for malignant neoplasm of colon: Secondary | ICD-10-CM | POA: Diagnosis not present

## 2021-06-05 DIAGNOSIS — Z1212 Encounter for screening for malignant neoplasm of rectum: Secondary | ICD-10-CM | POA: Diagnosis not present

## 2021-07-27 DIAGNOSIS — E785 Hyperlipidemia, unspecified: Secondary | ICD-10-CM | POA: Diagnosis not present

## 2021-07-27 DIAGNOSIS — I10 Essential (primary) hypertension: Secondary | ICD-10-CM | POA: Diagnosis not present

## 2021-07-27 DIAGNOSIS — E1169 Type 2 diabetes mellitus with other specified complication: Secondary | ICD-10-CM | POA: Diagnosis not present

## 2021-07-27 DIAGNOSIS — E669 Obesity, unspecified: Secondary | ICD-10-CM | POA: Diagnosis not present

## 2021-11-19 DIAGNOSIS — I10 Essential (primary) hypertension: Secondary | ICD-10-CM | POA: Diagnosis not present

## 2021-11-19 DIAGNOSIS — R7309 Other abnormal glucose: Secondary | ICD-10-CM | POA: Diagnosis not present

## 2021-11-19 DIAGNOSIS — E785 Hyperlipidemia, unspecified: Secondary | ICD-10-CM | POA: Diagnosis not present

## 2021-11-23 DIAGNOSIS — E785 Hyperlipidemia, unspecified: Secondary | ICD-10-CM | POA: Diagnosis not present

## 2021-11-23 DIAGNOSIS — I10 Essential (primary) hypertension: Secondary | ICD-10-CM | POA: Diagnosis not present

## 2021-11-23 DIAGNOSIS — E668 Other obesity: Secondary | ICD-10-CM | POA: Diagnosis not present

## 2021-11-23 DIAGNOSIS — Z0000 Encounter for general adult medical examination without abnormal findings: Secondary | ICD-10-CM | POA: Diagnosis not present

## 2021-12-29 DIAGNOSIS — S83282D Other tear of lateral meniscus, current injury, left knee, subsequent encounter: Secondary | ICD-10-CM | POA: Diagnosis not present

## 2022-01-27 ENCOUNTER — Other Ambulatory Visit: Payer: Self-pay | Admitting: Family Medicine

## 2022-01-27 DIAGNOSIS — Z1231 Encounter for screening mammogram for malignant neoplasm of breast: Secondary | ICD-10-CM

## 2022-03-18 ENCOUNTER — Ambulatory Visit
Admission: RE | Admit: 2022-03-18 | Discharge: 2022-03-18 | Disposition: A | Discharge status: Home / Self Care | Payer: HMO | Source: Ambulatory Visit | Attending: Family Medicine | Admitting: Family Medicine

## 2022-03-18 DIAGNOSIS — Z1231 Encounter for screening mammogram for malignant neoplasm of breast: Secondary | ICD-10-CM

## 2022-03-19 ENCOUNTER — Other Ambulatory Visit: Payer: Self-pay | Admitting: Family Medicine

## 2022-03-19 DIAGNOSIS — R928 Other abnormal and inconclusive findings on diagnostic imaging of breast: Secondary | ICD-10-CM

## 2022-03-25 ENCOUNTER — Other Ambulatory Visit: Payer: Self-pay | Admitting: Family Medicine

## 2022-03-25 ENCOUNTER — Ambulatory Visit
Admission: RE | Admit: 2022-03-25 | Discharge: 2022-03-25 | Disposition: A | Discharge status: Home / Self Care | Payer: HMO | Source: Ambulatory Visit | Attending: Family Medicine | Admitting: Family Medicine

## 2022-03-25 DIAGNOSIS — M130 Polyarthritis, unspecified: Secondary | ICD-10-CM

## 2022-04-02 ENCOUNTER — Ambulatory Visit
Admission: RE | Admit: 2022-04-02 | Discharge: 2022-04-02 | Disposition: A | Discharge status: Home / Self Care | Payer: HMO | Source: Ambulatory Visit | Attending: Family Medicine | Admitting: Family Medicine

## 2022-04-02 DIAGNOSIS — R928 Other abnormal and inconclusive findings on diagnostic imaging of breast: Secondary | ICD-10-CM

## 2022-05-10 DIAGNOSIS — I10 Essential (primary) hypertension: Secondary | ICD-10-CM | POA: Diagnosis not present

## 2022-05-10 DIAGNOSIS — M19042 Primary osteoarthritis, left hand: Secondary | ICD-10-CM | POA: Diagnosis not present

## 2022-05-10 DIAGNOSIS — M169 Osteoarthritis of hip, unspecified: Secondary | ICD-10-CM | POA: Diagnosis not present

## 2022-05-10 DIAGNOSIS — M19041 Primary osteoarthritis, right hand: Secondary | ICD-10-CM | POA: Diagnosis not present

## 2022-05-10 DIAGNOSIS — E1169 Type 2 diabetes mellitus with other specified complication: Secondary | ICD-10-CM | POA: Diagnosis not present

## 2022-05-27 DIAGNOSIS — E119 Type 2 diabetes mellitus without complications: Secondary | ICD-10-CM | POA: Diagnosis not present

## 2022-05-27 DIAGNOSIS — H401131 Primary open-angle glaucoma, bilateral, mild stage: Secondary | ICD-10-CM | POA: Diagnosis not present

## 2022-05-27 DIAGNOSIS — H2513 Age-related nuclear cataract, bilateral: Secondary | ICD-10-CM | POA: Diagnosis not present

## 2022-05-27 DIAGNOSIS — H43813 Vitreous degeneration, bilateral: Secondary | ICD-10-CM | POA: Diagnosis not present

## 2022-06-02 ENCOUNTER — Ambulatory Visit (INDEPENDENT_AMBULATORY_CARE_PROVIDER_SITE_OTHER): Payer: PPO | Admitting: Podiatry

## 2022-06-02 DIAGNOSIS — M7671 Peroneal tendinitis, right leg: Secondary | ICD-10-CM | POA: Diagnosis not present

## 2022-06-02 MED ORDER — METHYLPREDNISOLONE 4 MG PO TBPK: ORAL_TABLET | ORAL | 0 refills | Status: AC

## 2022-06-02 NOTE — Progress Notes (Signed)
Subjective:  Patient ID: Jasmine Stewart, female    DOB: 1945-09-05,  MRN: [id number]  Chief Complaint  Patient presents with   Foot Pain    Pt stated that she has some swelling in right foot     77 y.o. female presents with the above complaint.  Patient presents with right lateral foot pain.  Patient states that this came out of nowhere has been hurting her.  She states it was very swollen and has been hurting with every step.  She states pain scale 7 out of 10 dull achy in nature she would like to discuss treatment options for it.  She has a boot at home.   Review of Systems: Negative except as noted in the HPI. Denies N/V/F/Ch.  No past medical history on file.  Current Outpatient Medications:    methylPREDNISolone (MEDROL DOSEPAK) 4 MG TBPK tablet, Take as directed, Disp: 21 each, Rfl: 0   atorvastatin (LIPITOR) 10 MG tablet, , Disp: , Rfl:    Cholecalciferol (VITAMIN D-1000 MAX ST) 25 MCG (1000 UT) tablet, Take by mouth., Disp: , Rfl:    fexofenadine (ALLEGRA) 180 MG tablet, TK 1 T PO D, Disp: , Rfl:    HYDROcodone-acetaminophen (NORCO) 5-325 MG tablet, Take 1 tablet by mouth every 6 (six) hours as needed for moderate pain., Disp: 30 tablet, Rfl: 0   ibuprofen (ADVIL) 800 MG tablet, Take 1 tablet (800 mg total) by mouth every 6 (six) hours as needed., Disp: 60 tablet, Rfl: 1   JANUVIA 100 MG tablet, TK 1 T PO QD, Disp: , Rfl:    levothyroxine (SYNTHROID) 50 MCG tablet, , Disp: , Rfl:    lisinopril-hydrochlorothiazide (ZESTORETIC) 20-25 MG tablet, , Disp: , Rfl:    LUMIGAN 0.01 % SOLN, , Disp: , Rfl:    metFORMIN (GLUCOPHAGE) 500 MG tablet, , Disp: , Rfl:    oxyCODONE-acetaminophen (PERCOCET) 5-325 MG tablet, Take 1-2 tablets by mouth every 4 (four) hours as needed for severe pain., Disp: 30 tablet, Rfl: 0  Social History   Tobacco Use  Smoking Status Not on file  Smokeless Tobacco Not on file    Allergies  Allergen Reactions   Iohexol       Desc: ANAPHYLAXIS WITH IV  CONTRAST!    Objective:  There were no vitals filed for this visit. There is no height or weight on file to calculate BMI. Constitutional Well developed. Well nourished.  Vascular Dorsalis pedis pulses palpable bilaterally. Posterior tibial pulses palpable bilaterally. Capillary refill normal to all digits.  No cyanosis or clubbing noted. Pedal hair growth normal.  Neurologic Normal speech. Oriented to person, place, and time. Epicritic sensation to light touch grossly present bilaterally.  Dermatologic Nails well groomed and normal in appearance. No open wounds. No skin lesions.  Orthopedic: Pain on palpation along the course of the peroneal tendon pain with resisted eversion dorsiflexion of the foot no pain with plantarflexion inversion of the foot.  No pain at the ATFL ligament posterior tibial tendon and Achilles tendon   Radiographs: None Assessment:   1. Peroneal tendinitis of right lower extremity    Plan:  Patient was evaluated and treated and all questions answered.  Right peroneal tendinitis -All questions and concerns were discussed with the patient in extensive detail -Given the amount of pain that she is having she will benefit from cam boot immobilization she already has a cam boot at home she will place herself in it for next 4 weeks.  She will also  benefit from [place].  I discussed with patient she states understanding -If there is still some residual pain Will discuss steroid injection  No follow-ups on file.

## 2022-06-24 IMAGING — MG MM DIGITAL SCREENING BILAT W/ TOMO AND CAD
6 of 10 series · 6 of 30 positions shown · non-contrast
Comparison: Previous exam(s).

CLINICAL DATA: Screening.

EXAM:
DIGITAL SCREENING BILATERAL MAMMOGRAM WITH TOMOSYNTHESIS AND CAD
TECHNIQUE: Bilateral screening digital craniocaudal and mediolateral oblique
mammograms were obtained. Bilateral screening digital breast
tomosynthesis was performed. The images were evaluated with
computer-aided detection.

[R CC synth-2D]
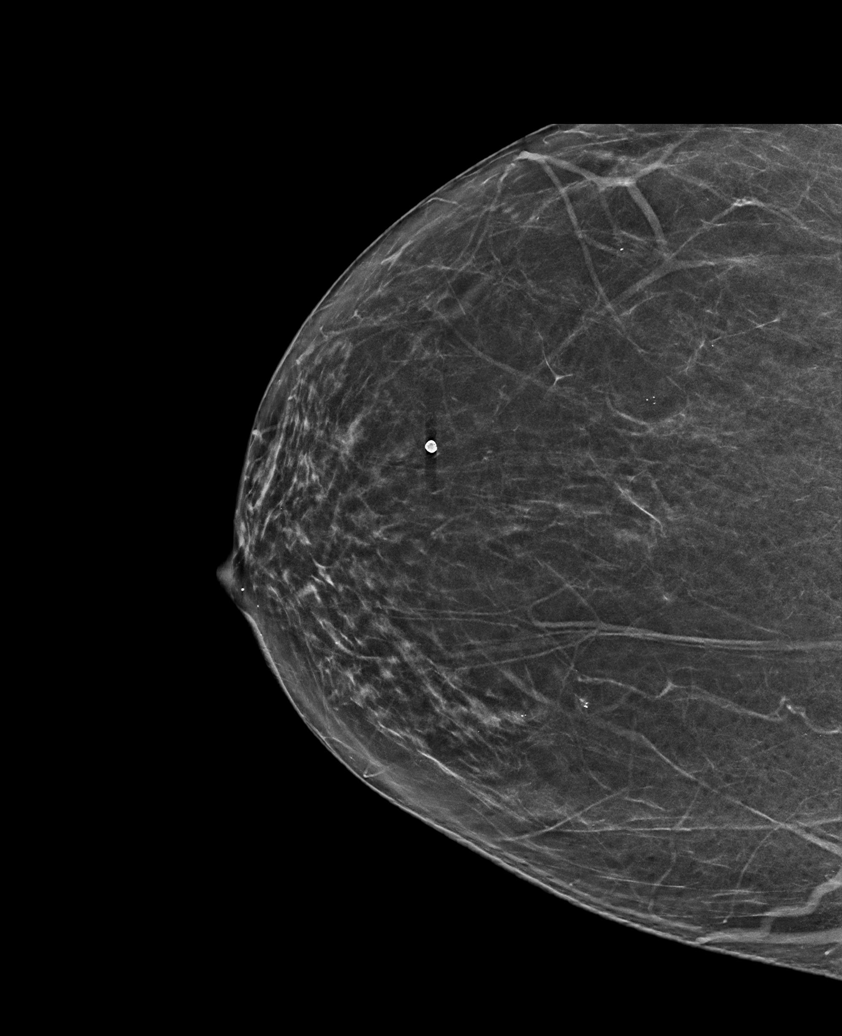

[L CC synth-2D]
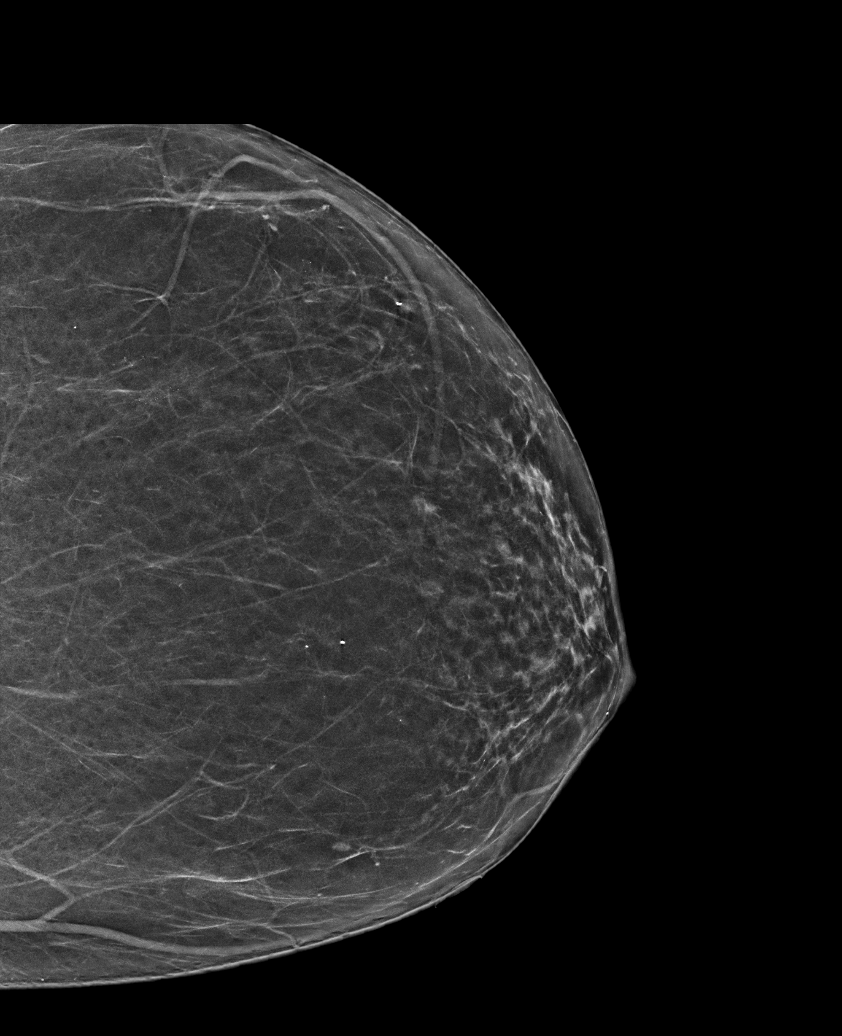

[R MLO synth-2D (1 of 2)]
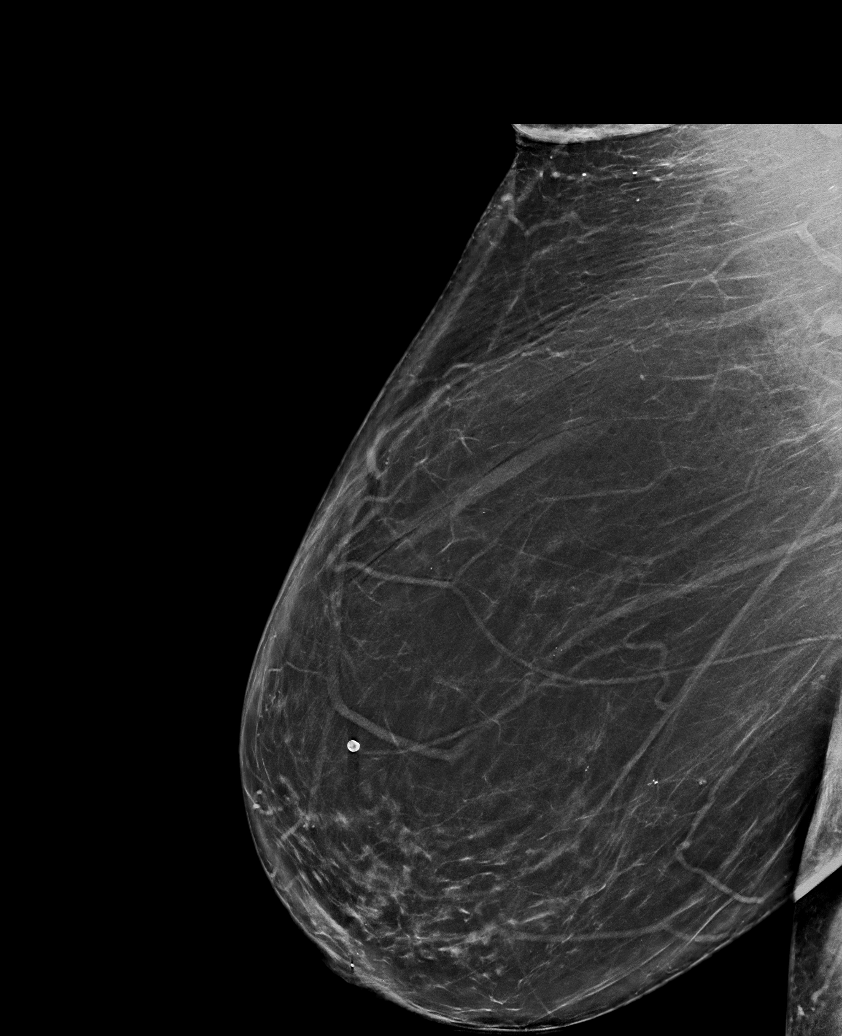

[R MLO synth-2D (2 of 2)]
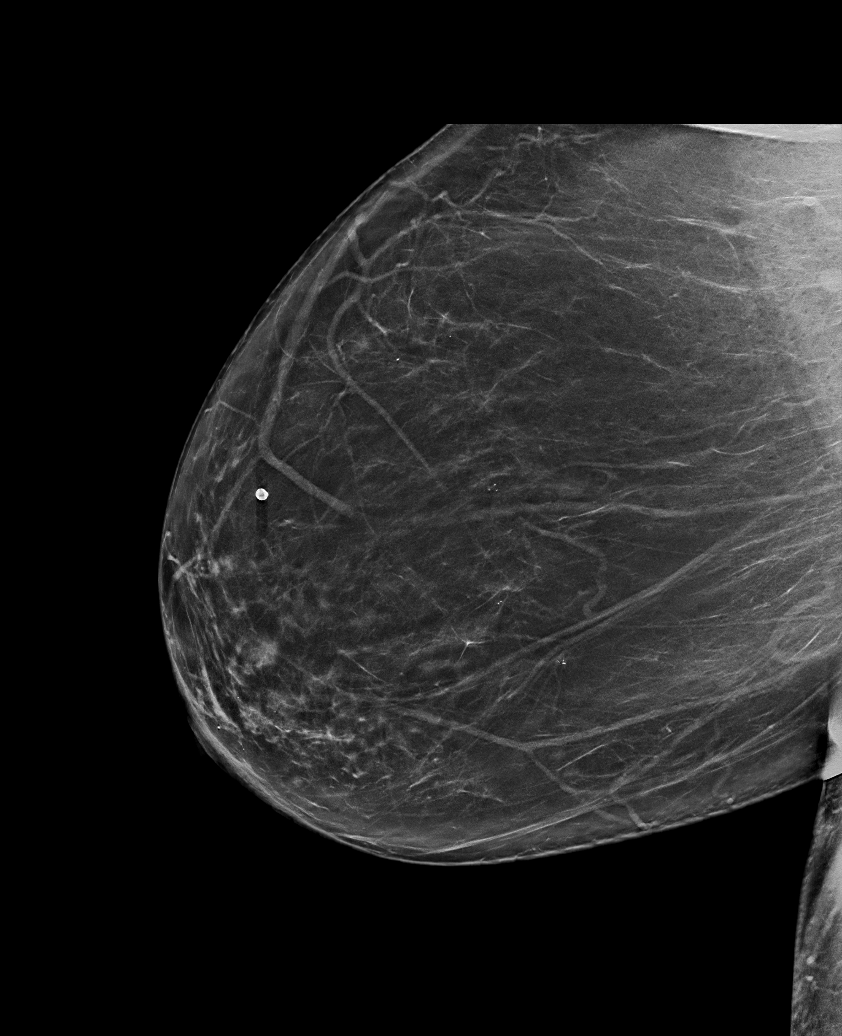

[L MLO synth-2D]
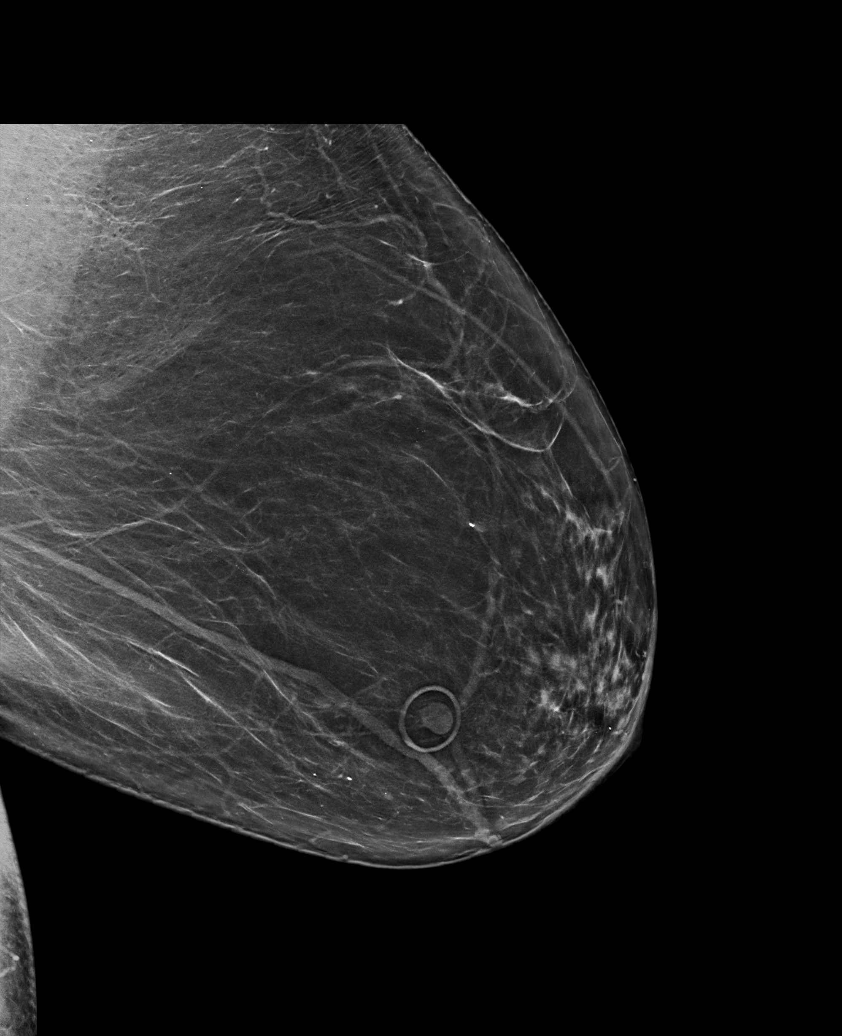

[L MLO tomo · tomo slice 41/81.0]
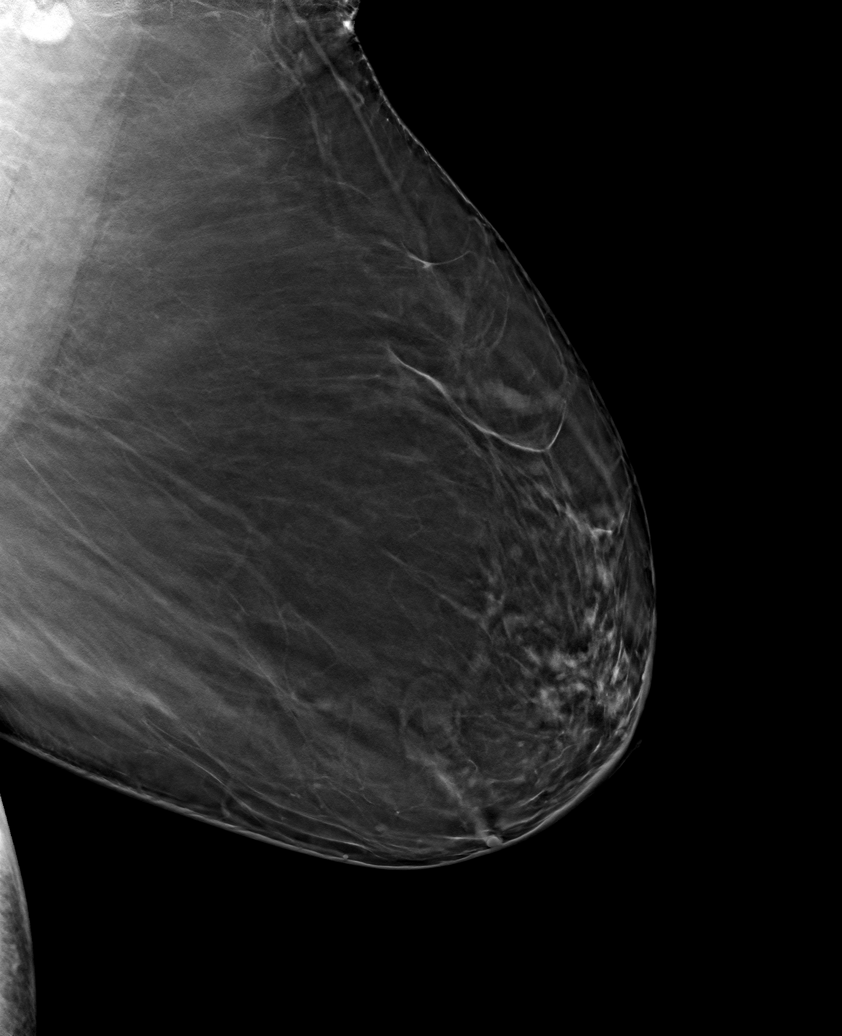

[6 of 30 positions shown; findings below may reference images not displayed]

ACR Breast Density Category b: There are scattered areas of
fibroglandular density.
FINDINGS: There are no findings suspicious for malignancy.
IMPRESSION: No mammographic evidence of malignancy. A result letter of this
screening mammogram will be mailed directly to the patient.

RECOMMENDATION:
Screening mammogram in one year. (Code:51-O-LD2)

BI-RADS CATEGORY  1: Negative.

## 2022-06-29 ENCOUNTER — Encounter: Payer: Self-pay | Admitting: Podiatry

## 2022-06-29 ENCOUNTER — Ambulatory Visit (INDEPENDENT_AMBULATORY_CARE_PROVIDER_SITE_OTHER): Payer: PPO | Admitting: Podiatry

## 2022-06-29 DIAGNOSIS — M7671 Peroneal tendinitis, right leg: Secondary | ICD-10-CM

## 2022-06-29 NOTE — Progress Notes (Signed)
Subjective:  Patient ID: Jasmine Stewart, female    DOB: [birth year],  MRN: [id number]  Chief Complaint  Patient presents with   Foot Pain    Pt stated that she is doing a little better     77 y.o. female presents with the above complaint.  Patient presents with right lateral foot pain.  Patient states that this came out of nowhere has been hurting her.  She states it was very swollen and has been hurting with every step.  She states pain scale 7 out of 10 dull achy in nature she would like to discuss treatment options for it.  She has a boot at home.   Review of Systems: Negative except as noted in the HPI. Denies N/V/F/Ch.  History reviewed. No pertinent past medical history.  Current Outpatient Medications:    atorvastatin (LIPITOR) 10 MG tablet, , Disp: , Rfl:    Cholecalciferol (VITAMIN D-1000 MAX ST) 25 MCG (1000 UT) tablet, Take by mouth., Disp: , Rfl:    fexofenadine (ALLEGRA) 180 MG tablet, TK 1 T PO D, Disp: , Rfl:    HYDROcodone-acetaminophen (NORCO) 5-325 MG tablet, Take 1 tablet by mouth every 6 (six) hours as needed for moderate pain., Disp: 30 tablet, Rfl: 0   ibuprofen (ADVIL) 800 MG tablet, Take 1 tablet (800 mg total) by mouth every 6 (six) hours as needed., Disp: 60 tablet, Rfl: 1   JANUVIA 100 MG tablet, TK 1 T PO QD, Disp: , Rfl:    levothyroxine (SYNTHROID) 50 MCG tablet, , Disp: , Rfl:    lisinopril-hydrochlorothiazide (ZESTORETIC) 20-25 MG tablet, , Disp: , Rfl:    LUMIGAN 0.01 % SOLN, , Disp: , Rfl:    metFORMIN (GLUCOPHAGE) 500 MG tablet, , Disp: , Rfl:    methylPREDNISolone (MEDROL DOSEPAK) 4 MG TBPK tablet, Take as directed, Disp: 21 each, Rfl: 0   oxyCODONE-acetaminophen (PERCOCET) 5-325 MG tablet, Take 1-2 tablets by mouth every 4 (four) hours as needed for severe pain., Disp: 30 tablet, Rfl: 0  Social History   Tobacco Use  Smoking Status Not on file  Smokeless Tobacco Not on file    Allergies  Allergen Reactions   Iohexol       Desc:  ANAPHYLAXIS WITH IV CONTRAST!    Objective:  There were no vitals filed for this visit. There is no height or weight on file to calculate BMI. Constitutional Well developed. Well nourished.  Vascular Dorsalis pedis pulses palpable bilaterally. Posterior tibial pulses palpable bilaterally. Capillary refill normal to all digits.  No cyanosis or clubbing noted. Pedal hair growth normal.  Neurologic Normal speech. Oriented to person, place, and time. Epicritic sensation to light touch grossly present bilaterally.  Dermatologic Nails well groomed and normal in appearance. No open wounds. No skin lesions.  Orthopedic: Pain on palpation along the course of the peroneal tendon pain with resisted eversion dorsiflexion of the foot no pain with plantarflexion inversion of the foot.  No pain at the ATFL ligament posterior tibial tendon and Achilles tendon   Radiographs: None Assessment:   1. Peroneal tendinitis of right lower extremity     Plan:  Patient was evaluated and treated and all questions answered.  Right peroneal tendinitis -All questions and concerns were discussed with the patient in extensive detail -Clinically her pain is improved considerably..  Begin to transition out of the boot into regular shoe with a Tri-Lock ankle brace.  She still has some residual pain therefore she will benefit from steroid injection I  discussed the risk of rupture associate with this she states understanding.  She would like to proceed despite the risks -A steroid injection was performed at right lateral foot at point of maximal tenderness using 1% plain Lidocaine and 10 mg of Kenalog. This was well tolerated. -I discussed shoe gear modification in extensive detail.  No follow-ups on file.

## 2022-07-11 DIAGNOSIS — E039 Hypothyroidism, unspecified: Secondary | ICD-10-CM | POA: Diagnosis not present

## 2022-07-11 DIAGNOSIS — I10 Essential (primary) hypertension: Secondary | ICD-10-CM | POA: Diagnosis not present

## 2022-07-11 DIAGNOSIS — E119 Type 2 diabetes mellitus without complications: Secondary | ICD-10-CM | POA: Diagnosis not present

## 2022-08-25 DIAGNOSIS — I10 Essential (primary) hypertension: Secondary | ICD-10-CM | POA: Diagnosis not present

## 2022-08-25 DIAGNOSIS — E039 Hypothyroidism, unspecified: Secondary | ICD-10-CM | POA: Diagnosis not present

## 2022-08-25 DIAGNOSIS — E785 Hyperlipidemia, unspecified: Secondary | ICD-10-CM | POA: Diagnosis not present

## 2022-08-25 DIAGNOSIS — N189 Chronic kidney disease, unspecified: Secondary | ICD-10-CM | POA: Diagnosis not present

## 2022-08-25 DIAGNOSIS — E1169 Type 2 diabetes mellitus with other specified complication: Secondary | ICD-10-CM | POA: Diagnosis not present

## 2022-08-30 DIAGNOSIS — E1169 Type 2 diabetes mellitus with other specified complication: Secondary | ICD-10-CM | POA: Diagnosis not present

## 2022-08-30 DIAGNOSIS — E785 Hyperlipidemia, unspecified: Secondary | ICD-10-CM | POA: Diagnosis not present

## 2022-08-30 DIAGNOSIS — I10 Essential (primary) hypertension: Secondary | ICD-10-CM | POA: Diagnosis not present

## 2022-08-30 DIAGNOSIS — E089 Diabetes mellitus due to underlying condition without complications: Secondary | ICD-10-CM | POA: Diagnosis not present

## 2022-08-30 DIAGNOSIS — E039 Hypothyroidism, unspecified: Secondary | ICD-10-CM | POA: Diagnosis not present

## 2022-09-22 ENCOUNTER — Ambulatory Visit (INDEPENDENT_AMBULATORY_CARE_PROVIDER_SITE_OTHER): Payer: PPO | Admitting: Podiatry

## 2022-09-22 DIAGNOSIS — M7662 Achilles tendinitis, left leg: Secondary | ICD-10-CM | POA: Diagnosis not present

## 2022-09-22 NOTE — Progress Notes (Signed)
  Subjective:  Patient ID: Jasmine Stewart, female    DOB: 1945-07-02,  MRN: [id number]  Chief Complaint  Patient presents with   Foot Pain    77 y.o. female presents with the above complaint.  Patient presents with left Achilles tendon insertional pain.  Patient states came out of nowhere is progressive and where she went to get it evaluated she has previous problems on the right side but now is down on the left side.  She has multiple boots at home denies any other acute complaints.   Review of Systems: Negative except as noted in the HPI. Denies N/V/F/Ch.  No past medical history on file.  Current Outpatient Medications:    atorvastatin (LIPITOR) 10 MG tablet, , Disp: , Rfl:    Cholecalciferol (VITAMIN D-1000 MAX ST) 25 MCG (1000 UT) tablet, Take by mouth., Disp: , Rfl:    fexofenadine (ALLEGRA) 180 MG tablet, TK 1 T PO D, Disp: , Rfl:    HYDROcodone-acetaminophen (NORCO) 5-325 MG tablet, Take 1 tablet by mouth every 6 (six) hours as needed for moderate pain., Disp: 30 tablet, Rfl: 0   ibuprofen (ADVIL) 800 MG tablet, Take 1 tablet (800 mg total) by mouth every 6 (six) hours as needed., Disp: 60 tablet, Rfl: 1   JANUVIA 100 MG tablet, TK 1 T PO QD, Disp: , Rfl:    levothyroxine (SYNTHROID) 50 MCG tablet, , Disp: , Rfl:    lisinopril-hydrochlorothiazide (ZESTORETIC) 20-25 MG tablet, , Disp: , Rfl:    LUMIGAN 0.01 % SOLN, , Disp: , Rfl:    metFORMIN (GLUCOPHAGE) 500 MG tablet, , Disp: , Rfl:    methylPREDNISolone (MEDROL DOSEPAK) 4 MG TBPK tablet, Take as directed, Disp: 21 each, Rfl: 0   oxyCODONE-acetaminophen (PERCOCET) 5-325 MG tablet, Take 1-2 tablets by mouth every 4 (four) hours as needed for severe pain., Disp: 30 tablet, Rfl: 0  Social History   Tobacco Use  Smoking Status Not on file  Smokeless Tobacco Not on file    Allergies  Allergen Reactions   Iohexol       Desc: ANAPHYLAXIS WITH IV CONTRAST!    Objective:  There were no vitals filed for this visit. There  is no height or weight on file to calculate BMI. Constitutional Well developed. Well nourished.  Vascular Dorsalis pedis pulses palpable bilaterally. Posterior tibial pulses palpable bilaterally. Capillary refill normal to all digits.  No cyanosis or clubbing noted. Pedal hair growth normal.  Neurologic Normal speech. Oriented to person, place, and time. Epicritic sensation to light touch grossly present bilaterally.  Dermatologic Nails well groomed and normal in appearance. No open wounds. No skin lesions.  Orthopedic: Pain on palpation to the left Achilles tendon insertion pain.  Pain with dorsiflexion of the ankle joint no pain with plantarflexion of the ankle joint positive Silfverskiold test other gastrocnemius equinus.   Radiographs: None Assessment:   1. Achilles tendinitis, left leg    Plan:  Patient was evaluated and treated and all questions answered.  Left Achilles tendinitis -All questions or concerns were discussed with the patient in extensive detail given the amount of pain that she was experiencing she will benefit from cam boot immobilization to allow the soft tissue structure to heal appropriately.  She already has a boot at home she will place herself in it. -If there is still some residual pain patient will benefit from steroid injection versus MRI.  She states understanding  No follow-ups on file.

## 2022-09-23 DIAGNOSIS — H2513 Age-related nuclear cataract, bilateral: Secondary | ICD-10-CM | POA: Diagnosis not present

## 2022-09-23 DIAGNOSIS — E119 Type 2 diabetes mellitus without complications: Secondary | ICD-10-CM | POA: Diagnosis not present

## 2022-09-23 DIAGNOSIS — H401131 Primary open-angle glaucoma, bilateral, mild stage: Secondary | ICD-10-CM | POA: Diagnosis not present

## 2022-09-23 DIAGNOSIS — H43813 Vitreous degeneration, bilateral: Secondary | ICD-10-CM | POA: Diagnosis not present

## 2022-09-29 ENCOUNTER — Telehealth: Payer: Self-pay | Admitting: Podiatry

## 2022-09-29 MED ORDER — METHYLPREDNISOLONE 4 MG PO TBPK: ORAL_TABLET | ORAL | 0 refills | Status: AC

## 2022-09-29 MED ORDER — MELOXICAM 15 MG PO TABS: 15.0000 mg | ORAL_TABLET | Freq: Every day | ORAL | 0 refills | Status: AC

## 2022-09-29 NOTE — Telephone Encounter (Signed)
Called pt to let her know Rx was sent to her pharmacy

## 2022-09-29 NOTE — Telephone Encounter (Signed)
Pt was seen on 09/22/22 for foot pain and swelling and is in a boot. Pt stated Dr. Allena Katz did not prescribe any medication and pt states she is in a lot of pain.

## 2022-09-29 NOTE — Telephone Encounter (Signed)
Called pt to let her know Rx was sent to pharmacy

## 2022-09-29 NOTE — Addendum Note (Signed)
Addended by: Nicholes Rough on: 09/29/2022 08:54 AM   Modules accepted: Orders

## 2022-10-20 ENCOUNTER — Ambulatory Visit: Payer: PPO | Admitting: Podiatry

## 2022-10-20 DIAGNOSIS — M9262 Juvenile osteochondrosis of tarsus, left ankle: Secondary | ICD-10-CM | POA: Diagnosis not present

## 2022-10-20 DIAGNOSIS — M7662 Achilles tendinitis, left leg: Secondary | ICD-10-CM

## 2022-10-20 NOTE — Progress Notes (Signed)
Subjective:  Patient ID: Jasmine Stewart, female    DOB: 1945/12/18,  MRN: [id number]  Chief Complaint  Patient presents with   Foot Pain    Pt stated that she is doing better    77 y.o. female presents with the above complaint.  Patient presents for follow-up of left Achilles tendinitis insertional plane with underlying Haglund's deformity.  She states that it is doing better the cam boot immobilization helped.  She still has some residual pain.  She would like to discuss treatment options for that.   Review of Systems: Negative except as noted in the HPI. Denies N/V/F/Ch.  No past medical history on file.  Current Outpatient Medications:    atorvastatin (LIPITOR) 10 MG tablet, , Disp: , Rfl:    Cholecalciferol (VITAMIN D-1000 MAX ST) 25 MCG (1000 UT) tablet, Take by mouth., Disp: , Rfl:    fexofenadine (ALLEGRA) 180 MG tablet, TK 1 T PO D, Disp: , Rfl:    HYDROcodone-acetaminophen (NORCO) 5-325 MG tablet, Take 1 tablet by mouth every 6 (six) hours as needed for moderate pain., Disp: 30 tablet, Rfl: 0   ibuprofen (ADVIL) 800 MG tablet, Take 1 tablet (800 mg total) by mouth every 6 (six) hours as needed., Disp: 60 tablet, Rfl: 1   JANUVIA 100 MG tablet, TK 1 T PO QD, Disp: , Rfl:    levothyroxine (SYNTHROID) 50 MCG tablet, , Disp: , Rfl:    lisinopril-hydrochlorothiazide (ZESTORETIC) 20-25 MG tablet, , Disp: , Rfl:    LUMIGAN 0.01 % SOLN, , Disp: , Rfl:    meloxicam (MOBIC) 15 MG tablet, Take 1 tablet (15 mg total) by mouth daily., Disp: 30 tablet, Rfl: 0   metFORMIN (GLUCOPHAGE) 500 MG tablet, , Disp: , Rfl:    methylPREDNISolone (MEDROL DOSEPAK) 4 MG TBPK tablet, Take as directed, Disp: 21 each, Rfl: 0   methylPREDNISolone (MEDROL DOSEPAK) 4 MG TBPK tablet, Take as directed, Disp: 21 each, Rfl: 0   oxyCODONE-acetaminophen (PERCOCET) 5-325 MG tablet, Take 1-2 tablets by mouth every 4 (four) hours as needed for severe pain., Disp: 30 tablet, Rfl: 0  Social History   Tobacco Use   Smoking Status Not on file  Smokeless Tobacco Not on file    Allergies  Allergen Reactions   Iohexol       Desc: ANAPHYLAXIS WITH IV CONTRAST!    Objective:  There were no vitals filed for this visit. There is no height or weight on file to calculate BMI. Constitutional Well developed. Well nourished.  Vascular Dorsalis pedis pulses palpable bilaterally. Posterior tibial pulses palpable bilaterally. Capillary refill normal to all digits.  No cyanosis or clubbing noted. Pedal hair growth normal.  Neurologic Normal speech. Oriented to person, place, and time. Epicritic sensation to light touch grossly present bilaterally.  Dermatologic Nails well groomed and normal in appearance. No open wounds. No skin lesions.  Orthopedic: Pain on palpation to the left Achilles tendon insertion pain.  Pain with dorsiflexion of the ankle joint no pain with plantarflexion of the ankle joint positive Silfverskiold test other gastrocnemius equinus.   Radiographs: None Assessment:   1. Achilles tendinitis, left leg   2. Haglund's deformity, left     Plan:  Patient was evaluated and treated and all questions answered.  Left Achilles tendinitis with underlying Haglund's deformity -All questions or concerns were discussed with the patient in extensive detail given cam boot immobilization brought some of her pain down however she still has some residual pain.  She would like  to discuss still steroid injection at this time.  I discussed the risk of rupture with surgery which she states also like to proceed despite the risks. -A steroid injection was performed at left Kager's fat pad using 1% plain Lidocaine and 10 mg of Kenalog. This was well tolerated.   No follow-ups on file.

## 2022-12-23 DIAGNOSIS — H401131 Primary open-angle glaucoma, bilateral, mild stage: Secondary | ICD-10-CM | POA: Diagnosis not present

## 2022-12-23 DIAGNOSIS — H43813 Vitreous degeneration, bilateral: Secondary | ICD-10-CM | POA: Diagnosis not present

## 2022-12-23 DIAGNOSIS — E119 Type 2 diabetes mellitus without complications: Secondary | ICD-10-CM | POA: Diagnosis not present

## 2022-12-23 DIAGNOSIS — H2513 Age-related nuclear cataract, bilateral: Secondary | ICD-10-CM | POA: Diagnosis not present

## 2022-12-29 DIAGNOSIS — I10 Essential (primary) hypertension: Secondary | ICD-10-CM | POA: Diagnosis not present

## 2022-12-29 DIAGNOSIS — E1169 Type 2 diabetes mellitus with other specified complication: Secondary | ICD-10-CM | POA: Diagnosis not present

## 2022-12-29 DIAGNOSIS — E785 Hyperlipidemia, unspecified: Secondary | ICD-10-CM | POA: Diagnosis not present

## 2022-12-29 DIAGNOSIS — E559 Vitamin D deficiency, unspecified: Secondary | ICD-10-CM | POA: Diagnosis not present

## 2022-12-31 DIAGNOSIS — N189 Chronic kidney disease, unspecified: Secondary | ICD-10-CM | POA: Diagnosis not present

## 2022-12-31 DIAGNOSIS — Z23 Encounter for immunization: Secondary | ICD-10-CM | POA: Diagnosis not present

## 2022-12-31 DIAGNOSIS — M130 Polyarthritis, unspecified: Secondary | ICD-10-CM | POA: Diagnosis not present

## 2022-12-31 DIAGNOSIS — I10 Essential (primary) hypertension: Secondary | ICD-10-CM | POA: Diagnosis not present

## 2022-12-31 DIAGNOSIS — E669 Obesity, unspecified: Secondary | ICD-10-CM | POA: Diagnosis not present

## 2023-01-03 DIAGNOSIS — I10 Essential (primary) hypertension: Secondary | ICD-10-CM | POA: Diagnosis not present

## 2023-01-03 DIAGNOSIS — E785 Hyperlipidemia, unspecified: Secondary | ICD-10-CM | POA: Diagnosis not present

## 2023-01-03 DIAGNOSIS — E1139 Type 2 diabetes mellitus with other diabetic ophthalmic complication: Secondary | ICD-10-CM | POA: Diagnosis not present

## 2023-01-03 DIAGNOSIS — E1169 Type 2 diabetes mellitus with other specified complication: Secondary | ICD-10-CM | POA: Diagnosis not present

## 2023-01-03 DIAGNOSIS — H42 Glaucoma in diseases classified elsewhere: Secondary | ICD-10-CM | POA: Diagnosis not present

## 2023-01-03 DIAGNOSIS — M1990 Unspecified osteoarthritis, unspecified site: Secondary | ICD-10-CM | POA: Diagnosis not present

## 2023-01-03 DIAGNOSIS — E669 Obesity, unspecified: Secondary | ICD-10-CM | POA: Diagnosis not present

## 2023-01-03 DIAGNOSIS — G8929 Other chronic pain: Secondary | ICD-10-CM | POA: Diagnosis not present

## 2023-01-03 DIAGNOSIS — H401131 Primary open-angle glaucoma, bilateral, mild stage: Secondary | ICD-10-CM | POA: Diagnosis not present

## 2023-01-03 DIAGNOSIS — E039 Hypothyroidism, unspecified: Secondary | ICD-10-CM | POA: Diagnosis not present

## 2023-02-04 DIAGNOSIS — H401131 Primary open-angle glaucoma, bilateral, mild stage: Secondary | ICD-10-CM | POA: Diagnosis not present

## 2023-02-08 ENCOUNTER — Other Ambulatory Visit: Payer: Self-pay | Admitting: Family Medicine

## 2023-02-08 DIAGNOSIS — Z1231 Encounter for screening mammogram for malignant neoplasm of breast: Secondary | ICD-10-CM

## 2023-02-24 DIAGNOSIS — E119 Type 2 diabetes mellitus without complications: Secondary | ICD-10-CM | POA: Diagnosis not present

## 2023-02-24 DIAGNOSIS — H401131 Primary open-angle glaucoma, bilateral, mild stage: Secondary | ICD-10-CM | POA: Diagnosis not present

## 2023-02-24 DIAGNOSIS — H2513 Age-related nuclear cataract, bilateral: Secondary | ICD-10-CM | POA: Diagnosis not present

## 2023-02-24 DIAGNOSIS — H43813 Vitreous degeneration, bilateral: Secondary | ICD-10-CM | POA: Diagnosis not present

## 2023-04-14 ENCOUNTER — Ambulatory Visit
Admission: RE | Admit: 2023-04-14 | Discharge: 2023-04-14 | Disposition: A | Discharge status: Home / Self Care | Payer: HMO | Source: Ambulatory Visit | Attending: Family Medicine | Admitting: Family Medicine

## 2023-04-14 DIAGNOSIS — Z1231 Encounter for screening mammogram for malignant neoplasm of breast: Secondary | ICD-10-CM

## 2023-04-29 DIAGNOSIS — E1169 Type 2 diabetes mellitus with other specified complication: Secondary | ICD-10-CM | POA: Diagnosis not present

## 2023-04-29 DIAGNOSIS — E785 Hyperlipidemia, unspecified: Secondary | ICD-10-CM | POA: Diagnosis not present

## 2023-04-29 DIAGNOSIS — I10 Essential (primary) hypertension: Secondary | ICD-10-CM | POA: Diagnosis not present

## 2023-05-05 DIAGNOSIS — D649 Anemia, unspecified: Secondary | ICD-10-CM | POA: Diagnosis not present

## 2023-05-05 DIAGNOSIS — E1169 Type 2 diabetes mellitus with other specified complication: Secondary | ICD-10-CM | POA: Diagnosis not present

## 2023-05-05 DIAGNOSIS — E6609 Other obesity due to excess calories: Secondary | ICD-10-CM | POA: Diagnosis not present

## 2023-05-05 DIAGNOSIS — M130 Polyarthritis, unspecified: Secondary | ICD-10-CM | POA: Diagnosis not present

## 2023-05-05 DIAGNOSIS — N189 Chronic kidney disease, unspecified: Secondary | ICD-10-CM | POA: Diagnosis not present

## 2023-05-05 DIAGNOSIS — I129 Hypertensive chronic kidney disease with stage 1 through stage 4 chronic kidney disease, or unspecified chronic kidney disease: Secondary | ICD-10-CM | POA: Diagnosis not present

## 2023-05-26 DIAGNOSIS — H401131 Primary open-angle glaucoma, bilateral, mild stage: Secondary | ICD-10-CM | POA: Diagnosis not present

## 2023-05-26 DIAGNOSIS — H2513 Age-related nuclear cataract, bilateral: Secondary | ICD-10-CM | POA: Diagnosis not present

## 2023-05-26 DIAGNOSIS — E119 Type 2 diabetes mellitus without complications: Secondary | ICD-10-CM | POA: Diagnosis not present

## 2023-05-26 DIAGNOSIS — H43813 Vitreous degeneration, bilateral: Secondary | ICD-10-CM | POA: Diagnosis not present

## 2023-07-11 DIAGNOSIS — N958 Other specified menopausal and perimenopausal disorders: Secondary | ICD-10-CM | POA: Diagnosis not present

## 2023-08-24 DIAGNOSIS — M130 Polyarthritis, unspecified: Secondary | ICD-10-CM | POA: Diagnosis not present

## 2023-08-24 DIAGNOSIS — E6609 Other obesity due to excess calories: Secondary | ICD-10-CM | POA: Diagnosis not present

## 2023-08-24 DIAGNOSIS — I129 Hypertensive chronic kidney disease with stage 1 through stage 4 chronic kidney disease, or unspecified chronic kidney disease: Secondary | ICD-10-CM | POA: Diagnosis not present

## 2023-08-24 DIAGNOSIS — E1169 Type 2 diabetes mellitus with other specified complication: Secondary | ICD-10-CM | POA: Diagnosis not present

## 2023-08-24 DIAGNOSIS — N189 Chronic kidney disease, unspecified: Secondary | ICD-10-CM | POA: Diagnosis not present

## 2023-08-24 DIAGNOSIS — D649 Anemia, unspecified: Secondary | ICD-10-CM | POA: Diagnosis not present

## 2023-08-24 DIAGNOSIS — E782 Mixed hyperlipidemia: Secondary | ICD-10-CM | POA: Diagnosis not present

## 2023-08-24 DIAGNOSIS — I10 Essential (primary) hypertension: Secondary | ICD-10-CM | POA: Diagnosis not present

## 2023-08-25 ENCOUNTER — Telehealth: Payer: Self-pay

## 2023-08-25 ENCOUNTER — Other Ambulatory Visit (HOSPITAL_COMMUNITY): Payer: Self-pay

## 2023-08-25 DIAGNOSIS — H401131 Primary open-angle glaucoma, bilateral, mild stage: Secondary | ICD-10-CM | POA: Diagnosis not present

## 2023-08-25 DIAGNOSIS — E119 Type 2 diabetes mellitus without complications: Secondary | ICD-10-CM | POA: Diagnosis not present

## 2023-08-25 DIAGNOSIS — H5203 Hypermetropia, bilateral: Secondary | ICD-10-CM | POA: Diagnosis not present

## 2023-08-25 DIAGNOSIS — H2513 Age-related nuclear cataract, bilateral: Secondary | ICD-10-CM | POA: Diagnosis not present

## 2023-08-25 DIAGNOSIS — H43813 Vitreous degeneration, bilateral: Secondary | ICD-10-CM | POA: Diagnosis not present

## 2023-08-25 NOTE — Telephone Encounter (Signed)
 Pharmacy Patient Advocate Encounter  Insurance verification completed.   The patient is insured through [facility] ADVANTAGE/RX ADVANCE   Ran test claim for Januvia. Currently a quantity of 30 is a 30 day supply and the co-pay is $0 .   This test claim was processed through [facility]- copay amounts may vary at other pharmacies due to pharmacy/plan contracts, or as the patient moves through the different stages of their insurance plan.

## 2023-08-29 DIAGNOSIS — E039 Hypothyroidism, unspecified: Secondary | ICD-10-CM | POA: Diagnosis not present

## 2023-08-29 DIAGNOSIS — E1122 Type 2 diabetes mellitus with diabetic chronic kidney disease: Secondary | ICD-10-CM | POA: Diagnosis not present

## 2023-08-29 DIAGNOSIS — R6510 Systemic inflammatory response syndrome (SIRS) of non-infectious origin without acute organ dysfunction: Secondary | ICD-10-CM | POA: Diagnosis not present

## 2023-08-29 DIAGNOSIS — Z6834 Body mass index (BMI) 34.0-34.9, adult: Secondary | ICD-10-CM | POA: Diagnosis not present

## 2023-08-29 DIAGNOSIS — D649 Anemia, unspecified: Secondary | ICD-10-CM | POA: Diagnosis not present

## 2023-08-29 DIAGNOSIS — N189 Chronic kidney disease, unspecified: Secondary | ICD-10-CM | POA: Diagnosis not present

## 2023-08-29 DIAGNOSIS — E6609 Other obesity due to excess calories: Secondary | ICD-10-CM | POA: Diagnosis not present

## 2023-08-29 DIAGNOSIS — I129 Hypertensive chronic kidney disease with stage 1 through stage 4 chronic kidney disease, or unspecified chronic kidney disease: Secondary | ICD-10-CM | POA: Diagnosis not present

## 2023-12-28 DIAGNOSIS — E119 Type 2 diabetes mellitus without complications: Secondary | ICD-10-CM | POA: Diagnosis not present

## 2023-12-28 DIAGNOSIS — I10 Essential (primary) hypertension: Secondary | ICD-10-CM | POA: Diagnosis not present

## 2023-12-28 DIAGNOSIS — E039 Hypothyroidism, unspecified: Secondary | ICD-10-CM | POA: Diagnosis not present

## 2023-12-28 DIAGNOSIS — I129 Hypertensive chronic kidney disease with stage 1 through stage 4 chronic kidney disease, or unspecified chronic kidney disease: Secondary | ICD-10-CM | POA: Diagnosis not present

## 2023-12-28 DIAGNOSIS — D649 Anemia, unspecified: Secondary | ICD-10-CM | POA: Diagnosis not present

## 2023-12-28 DIAGNOSIS — R6510 Systemic inflammatory response syndrome (SIRS) of non-infectious origin without acute organ dysfunction: Secondary | ICD-10-CM | POA: Diagnosis not present

## 2023-12-28 DIAGNOSIS — Z6834 Body mass index (BMI) 34.0-34.9, adult: Secondary | ICD-10-CM | POA: Diagnosis not present

## 2023-12-30 DIAGNOSIS — Z23 Encounter for immunization: Secondary | ICD-10-CM | POA: Diagnosis not present

## 2023-12-30 DIAGNOSIS — E6609 Other obesity due to excess calories: Secondary | ICD-10-CM | POA: Diagnosis not present

## 2023-12-30 DIAGNOSIS — E785 Hyperlipidemia, unspecified: Secondary | ICD-10-CM | POA: Diagnosis not present

## 2023-12-30 DIAGNOSIS — I129 Hypertensive chronic kidney disease with stage 1 through stage 4 chronic kidney disease, or unspecified chronic kidney disease: Secondary | ICD-10-CM | POA: Diagnosis not present

## 2023-12-30 DIAGNOSIS — E1122 Type 2 diabetes mellitus with diabetic chronic kidney disease: Secondary | ICD-10-CM | POA: Diagnosis not present

## 2023-12-30 DIAGNOSIS — N189 Chronic kidney disease, unspecified: Secondary | ICD-10-CM | POA: Diagnosis not present

## 2024-01-05 DIAGNOSIS — H43813 Vitreous degeneration, bilateral: Secondary | ICD-10-CM | POA: Diagnosis not present

## 2024-01-05 DIAGNOSIS — E119 Type 2 diabetes mellitus without complications: Secondary | ICD-10-CM | POA: Diagnosis not present

## 2024-01-05 DIAGNOSIS — H401131 Primary open-angle glaucoma, bilateral, mild stage: Secondary | ICD-10-CM | POA: Diagnosis not present

## 2024-01-05 DIAGNOSIS — H2513 Age-related nuclear cataract, bilateral: Secondary | ICD-10-CM | POA: Diagnosis not present

## 2024-01-24 DIAGNOSIS — E1122 Type 2 diabetes mellitus with diabetic chronic kidney disease: Secondary | ICD-10-CM | POA: Diagnosis not present

## 2024-01-24 DIAGNOSIS — N189 Chronic kidney disease, unspecified: Secondary | ICD-10-CM | POA: Diagnosis not present

## 2024-01-24 DIAGNOSIS — J398 Other specified diseases of upper respiratory tract: Secondary | ICD-10-CM | POA: Diagnosis not present

## 2024-02-22 DIAGNOSIS — J301 Allergic rhinitis due to pollen: Secondary | ICD-10-CM | POA: Diagnosis not present

## 2024-02-22 DIAGNOSIS — I10 Essential (primary) hypertension: Secondary | ICD-10-CM | POA: Diagnosis not present

## 2024-02-22 DIAGNOSIS — K2100 Gastro-esophageal reflux disease with esophagitis, without bleeding: Secondary | ICD-10-CM | POA: Diagnosis not present

## 2024-02-22 DIAGNOSIS — K5100 Ulcerative (chronic) pancolitis without complications: Secondary | ICD-10-CM | POA: Diagnosis not present

## 2024-03-22 ENCOUNTER — Other Ambulatory Visit: Payer: Self-pay | Admitting: Family Medicine

## 2024-03-22 DIAGNOSIS — Z1231 Encounter for screening mammogram for malignant neoplasm of breast: Secondary | ICD-10-CM

## 2024-04-19 ENCOUNTER — Ambulatory Visit
Admission: RE | Admit: 2024-04-19 | Discharge: 2024-04-19 | Disposition: A | Discharge status: Home / Self Care | Source: Ambulatory Visit | Attending: Family Medicine | Admitting: Family Medicine

## 2024-04-19 DIAGNOSIS — Z1231 Encounter for screening mammogram for malignant neoplasm of breast: Secondary | ICD-10-CM
